# Patient Record
Sex: Female | Born: 1990 | Race: White | Hispanic: No | Marital: Married | Smoking: Never smoker
Health system: Southern US, Community
[De-identification: ages and names within clinical notes are randomized; demographics above are authoritative.]

## PROBLEM LIST (undated history)

## (undated) ENCOUNTER — Inpatient Hospital Stay (HOSPITAL_COMMUNITY): Payer: Self-pay

## (undated) DIAGNOSIS — D649 Anemia, unspecified: Secondary | ICD-10-CM

## (undated) DIAGNOSIS — Z8279 Family history of other congenital malformations, deformations and chromosomal abnormalities: Secondary | ICD-10-CM

## (undated) DIAGNOSIS — T8189XA Other complications of procedures, not elsewhere classified, initial encounter: Secondary | ICD-10-CM

## (undated) DIAGNOSIS — I82409 Acute embolism and thrombosis of unspecified deep veins of unspecified lower extremity: Secondary | ICD-10-CM

## (undated) DIAGNOSIS — J45909 Unspecified asthma, uncomplicated: Secondary | ICD-10-CM

## (undated) HISTORY — PX: BACK SURGERY: SHX140

## (undated) HISTORY — DX: Acute embolism and thrombosis of unspecified deep veins of unspecified lower extremity: I82.409

## (undated) HISTORY — DX: Family history of other congenital malformations, deformations and chromosomal abnormalities: Z82.79

## (undated) HISTORY — DX: Other complications of procedures, not elsewhere classified, initial encounter: T81.89XA

## (undated) HISTORY — PX: EYE SURGERY: SHX253

## (undated) HISTORY — DX: Unspecified asthma, uncomplicated: J45.909

---
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
## 2009-01-28 ENCOUNTER — Encounter: Admission: RE | Admit: 2009-01-28 | Discharge: 2009-01-28 | Payer: Self-pay | Admitting: Orthopedic Surgery

## 2009-02-12 ENCOUNTER — Encounter: Admission: RE | Admit: 2009-02-12 | Discharge: 2009-02-12 | Payer: Self-pay | Admitting: Orthopedic Surgery

## 2010-07-06 ENCOUNTER — Emergency Department (HOSPITAL_BASED_OUTPATIENT_CLINIC_OR_DEPARTMENT_OTHER)
Admission: EM | Admit: 2010-07-06 | Discharge: 2010-07-06 | Disposition: A | Discharge status: Home / Self Care | Payer: 59 | Attending: Emergency Medicine | Admitting: Emergency Medicine

## 2010-07-06 DIAGNOSIS — L272 Dermatitis due to ingested food: Secondary | ICD-10-CM | POA: Insufficient documentation

## 2011-08-24 ENCOUNTER — Encounter: Payer: Self-pay | Admitting: Internal Medicine

## 2011-08-28 ENCOUNTER — Encounter: Payer: Self-pay | Admitting: Internal Medicine

## 2011-08-28 ENCOUNTER — Ambulatory Visit (INDEPENDENT_AMBULATORY_CARE_PROVIDER_SITE_OTHER): Payer: 59

## 2011-08-28 ENCOUNTER — Ambulatory Visit (INDEPENDENT_AMBULATORY_CARE_PROVIDER_SITE_OTHER): Payer: 59 | Admitting: Internal Medicine

## 2011-08-28 VITALS — BP 96/60 | HR 80 | Ht 60.25 in | Wt 127.0 lb

## 2011-08-28 DIAGNOSIS — R220 Localized swelling, mass and lump, head: Secondary | ICD-10-CM

## 2011-08-28 DIAGNOSIS — R221 Localized swelling, mass and lump, neck: Secondary | ICD-10-CM

## 2011-08-28 DIAGNOSIS — Z91018 Allergy to other foods: Secondary | ICD-10-CM

## 2011-08-28 DIAGNOSIS — T781XXA Other adverse food reactions, not elsewhere classified, initial encounter: Secondary | ICD-10-CM

## 2011-08-28 LAB — IGA: IgA: 140 mg/dL (ref 68–378)

## 2011-08-28 NOTE — Progress Notes (Signed)
  Subjective:    Patient ID: Melinda Higgins, female    DOB: 1990-07-08, 21 y.o.   MRN: [id number]  HPI Is a very pleasant 21 year old white woman who is here with her 10-week-old infant. She has had intermittent throat swelling problems. She has peanut and treatment allergies as well as a milk intolerance or allergy. She's had persistent issues and wonders if she might not have gluten intolerance or celiac disease. She has had numerous ER visits with her first and second pregnancy is. She also has a 64-month-old child. She will develop throat swelling, and required Benadryl or even other treatments for anaphylactic-like type reactions or anaphylaxis. I don't believe she's never had respiratory difficulty. I do not have access to those records.  She does have a history of seasonal allergies, some asthma as well as ease food allergies. Recent testing and stone she has a pork allergy as well. She is puzzled by her problems and had wondered if celiac disease might be causing his as she stopped gluten 5 days ago and since she is already somewhat better. Her father has celiac disease.  Allergies  Allergen Reactions  . Benadryl (Diphenhydramine Hcl)    No outpatient prescriptions prior to visit.   Past Medical History  Diagnosis Date  . Asthma    Past Surgical History  Procedure Date  . Eye surgery   . Cesarean section     2 times  . Back surgery     rods put in back   History   Social History  . Marital Status: Single                 Occupational History  . stay at home    Social History Main Topics  . Smoking status: Never Smoker   . Smokeless tobacco: None  . Alcohol Use: No  . Drug Use: No  .       Family History  Problem Relation Age of Onset  . Diabetes    . Heart disease     celiac disease in her for     Review of Systems  currently breast-feeding,    Objective:   Physical Exam General:  Well-developed, well-nourished and in no acute  distress Eyes:  anicteric. ENT:   Mouth and posterior pharynx free of lesions.  Neck:   supple w/o thyromegaly or mass.  Lungs: Clear to auscultation bilaterally. Heart:  S1S2, no rubs, murmurs, gallops. Abdomen:  soft, non-tender, no hepatosplenomegaly, hernia, or mass and BS+.  Lymph:  no cervical or supraclavicular adenopathy. Extremities:   no edema Skin   no rash. Neuro:  A&O x 3.  Psych:  appropriate mood and  Affect.        Assessment & Plan:   1. Throat swelling   2. Food allergy     I explained to the patient in the her symptoms are unusual manifestations of celiac disease. However not unreasonable test are considering her father's history as well. We'll start with IgA level in the tissue transglutaminase antibody IgA. If those are negative, I think it would be reasonable to pursue H. LAD Q. testing. If that were negative then she would not be someone with celiac disease. She has only been gluten-free for 5 days so I think is reasonable to do antibody testing and trust to negative result.    copy to Micah Noel, MD

## 2011-08-28 NOTE — Patient Instructions (Signed)
Please go to the basement to have your celiac (gluten) tests drawn. We will call you with the results and plans.  Copy will be sent to Dr. Barnetta Chapel.

## 2011-08-31 NOTE — Progress Notes (Signed)
Pt contacted and informed that Celiac test negative and that we want to do more testing, test orders put in computer and pt will come by and get these drawn.

## 2011-08-31 NOTE — Progress Notes (Signed)
Quick Note:  Antibody test is negative for celiac disease  She needs to be tested for presence of HLA DQ2 and DQ 8  Let me know if ?'s about what to order ______

## 2011-09-07 ENCOUNTER — Other Ambulatory Visit: Payer: 59

## 2011-09-07 DIAGNOSIS — Z91018 Allergy to other foods: Secondary | ICD-10-CM

## 2011-09-16 ENCOUNTER — Telehealth: Payer: Self-pay | Admitting: Internal Medicine

## 2011-09-16 NOTE — Telephone Encounter (Signed)
Patient advised we will call her when the results are available

## 2013-01-26 ENCOUNTER — Inpatient Hospital Stay (HOSPITAL_COMMUNITY)
Admission: AD | Admit: 2013-01-26 | Discharge: 2013-01-26 | Disposition: A | Discharge status: Home / Self Care | Payer: Medicaid Other | Source: Ambulatory Visit | Attending: Obstetrics and Gynecology | Admitting: Obstetrics and Gynecology

## 2013-01-26 ENCOUNTER — Encounter (HOSPITAL_COMMUNITY): Payer: Self-pay | Admitting: *Deleted

## 2013-01-26 ENCOUNTER — Inpatient Hospital Stay (HOSPITAL_COMMUNITY): Payer: Medicaid Other

## 2013-01-26 DIAGNOSIS — Z3201 Encounter for pregnancy test, result positive: Secondary | ICD-10-CM

## 2013-01-26 DIAGNOSIS — O99891 Other specified diseases and conditions complicating pregnancy: Secondary | ICD-10-CM | POA: Insufficient documentation

## 2013-01-26 DIAGNOSIS — Z3490 Encounter for supervision of normal pregnancy, unspecified, unspecified trimester: Secondary | ICD-10-CM

## 2013-01-26 LAB — HCG, QUANTITATIVE, PREGNANCY: hCG, Beta Chain, Quant, S: 72301 m[IU]/mL — ABNORMAL HIGH (ref <5)

## 2013-01-26 LAB — POCT PREGNANCY, URINE: Preg Test, Ur: POSITIVE — AB

## 2013-01-26 NOTE — MAU Note (Signed)
Pt was at pregnancy care center today. She said she should be 9 wek. Abd U/S at pregnancy care center unable to visualize a fetus. Sent her for U/S. Denies any pain or bleeding.

## 2013-01-26 NOTE — MAU Provider Note (Signed)
History     CSN: [id number]  Arrival date and time: 01/26/13 1526   First Provider Initiated Contact with Patient 01/26/13 1731      Chief Complaint  Patient presents with  . Possible Pregnancy   HPI Ms. Melinda Higgins is a 22 y.o. G3P2002 at [redacted]w[redacted]d who presents to MAU today after going to the pregnancy care center for early [place]. The patient states that she should be almost [redacted] weeks GA and they were unable to see anything in the uterus today. She denies abnormal discharge, vaginal bleeding or abdominal pain. The patient has had N/V and heartburn throughout the pregnancy, but would prefer to use natural remedies. She plans to go to [facility] in [place] for prenatal care.   OB History   Grav Para Term Preterm Abortions TAB SAB Ect Mult Living   3 2 2       2       Past Medical History  Diagnosis Date  . Asthma     Past Surgical History  Procedure Laterality Date  . Eye surgery    . Cesarean section      2 times  . Back surgery      rods put in back    Family History  Problem Relation Age of Onset  . Diabetes    . Heart disease      History  Substance Use Topics  . Smoking status: Never Smoker   . Smokeless tobacco: Not on file  . Alcohol Use: No    Allergies:  Allergies  Allergen Reactions  . Benadryl [Diphenhydramine Hcl]     No prescriptions prior to admission    Review of Systems  Constitutional: Positive for malaise/fatigue. Negative for fever.  Gastrointestinal: Positive for nausea and vomiting. Negative for abdominal pain, diarrhea and constipation.  Genitourinary: Negative for dysuria, urgency and frequency.       Neg - vaginal bleeding, discharge  Neurological: Positive for dizziness. Negative for loss of consciousness and weakness.   Physical Exam   Blood pressure 132/94, pulse 91, temperature 98.3 F (36.8 C), resp. rate 18, height 5\' 5"  (1.651 m), weight 119 lb 6.4 oz (54.159 kg), last menstrual period 11/25/2012.  Physical Exam   Constitutional: She is oriented to person, place, and time. She appears well-developed and well-nourished. No distress.  HENT:  Head: Normocephalic and atraumatic.  Cardiovascular: Normal rate.   Respiratory: Effort normal.  GI: Soft. She exhibits no distension and no mass. There is no tenderness. There is no rebound and no guarding.  Neurological: She is alert and oriented to person, place, and time.  Skin: Skin is warm and dry. No erythema.  Psychiatric: She has a normal mood and affect.   Results for orders placed during the hospital encounter of 01/26/13 (from the past 24 hour(s))  POCT PREGNANCY, URINE     Status: Abnormal   Collection Time    01/26/13  3:48 PM      Result Value Range   Preg Test, Ur POSITIVE (*) NEGATIVE  HCG, QUANTITATIVE, PREGNANCY     Status: Abnormal   Collection Time    01/26/13  4:45 PM      Result Value Range   hCG, Beta Chain, Quant, S 72301 (*) <5 mIU/mL   US Ob Comp Less 14 Wks  01/26/2013   CLINICAL DATA:  Inconclusive fetal viability, positive pregnancy test  EXAM: OBSTETRIC <14 WK [place] AND TRANSVAGINAL OB US  TECHNIQUE: Both transabdominal and transvaginal ultrasound examinations were performed for  complete evaluation of the gestation as well as the maternal uterus, adnexal regions, and pelvic cul-de-sac. Transvaginal technique was performed to assess early pregnancy.  COMPARISON:  None.  FINDINGS: Intrauterine gestational sac: Visualized/normal in shape.  Yolk sac:  Visualized  Embryo:  Visualized  Cardiac Activity: Visualized  Heart Rate:  175 bpm  CRL:   21  mm   8 w 5d                  [place] EDC: 09/02/13  Maternal uterus/adnexae: The ovaries are normal.  IMPRESSION: Single live intrauterine gestation with concordant MEASUREMENTS by today's crown-rump length compared to assigned gestational age of [redacted] weeks 6 days by LMP. No acute abnormality.   Electronically Signed   By: Christiana Pellant M.D.   On: 01/26/2013 18:14   US Ob Transvaginal  01/26/2013    CLINICAL DATA:  Inconclusive fetal viability, positive pregnancy test  EXAM: OBSTETRIC <14 WK [place] AND TRANSVAGINAL OB US  TECHNIQUE: Both transabdominal and transvaginal ultrasound examinations were performed for complete evaluation of the gestation as well as the maternal uterus, adnexal regions, and pelvic cul-de-sac. Transvaginal technique was performed to assess early pregnancy.  COMPARISON:  None.  FINDINGS: Intrauterine gestational sac: Visualized/normal in shape.  Yolk sac:  Visualized  Embryo:  Visualized  Cardiac Activity: Visualized  Heart Rate:  175 bpm  CRL:   21  mm   8 w 5d                  [place] EDC: 09/02/13  Maternal uterus/adnexae: The ovaries are normal.  IMPRESSION: Single live intrauterine gestation with concordant MEASUREMENTS by today's crown-rump length compared to assigned gestational age of [redacted] weeks 6 days by LMP. No acute abnormality.   Electronically Signed   By: Christiana Pellant M.D.   On: 01/26/2013 18:14    MAU Course  Procedures None  MDM +UPT Quant hCG and [place] today  Assessment and Plan  A: SIUP at [redacted]w[redacted]d with normal cardiac activity  P: Discharge home First trimester warning signs reviewed Patient declined Rx for anti-emetics or antacids Pregnancy confirmation letter given Patient advised to make an appointment to start care with [facility] in [place] Patient may return to MAU as needed or if her condition were to change or worsen  Freddi Starr, PA-C  01/26/2013, 5:31 PM

## 2013-01-31 NOTE — MAU Provider Note (Signed)
Attestation of Attending Supervision of Advanced Practitioner (CNM/NP): Evaluation and management procedures were performed by the Advanced Practitioner under my supervision and collaboration.  I have reviewed the Advanced Practitioner's note and chart, and I agree with the management and plan.  Parris Signer 01/31/2013 4:23 PM   

## 2013-03-09 ENCOUNTER — Encounter (HOSPITAL_COMMUNITY): Payer: Self-pay | Admitting: *Deleted

## 2013-03-09 ENCOUNTER — Inpatient Hospital Stay (HOSPITAL_COMMUNITY)
Admission: AD | Admit: 2013-03-09 | Discharge: 2013-03-09 | Disposition: A | Discharge status: Home / Self Care | Payer: Medicaid Other | Source: Ambulatory Visit | Attending: Obstetrics & Gynecology | Admitting: Obstetrics & Gynecology

## 2013-03-09 DIAGNOSIS — O210 Mild hyperemesis gravidarum: Secondary | ICD-10-CM

## 2013-03-09 DIAGNOSIS — O219 Vomiting of pregnancy, unspecified: Secondary | ICD-10-CM

## 2013-03-09 LAB — URINALYSIS, ROUTINE W REFLEX MICROSCOPIC
Bilirubin Urine: NEGATIVE
Hgb urine dipstick: NEGATIVE
Ketones, ur: NEGATIVE mg/dL
Nitrite: NEGATIVE
Specific Gravity, Urine: 1.02 (ref 1.005–1.030)
Urobilinogen, UA: 0.2 mg/dL (ref 0.0–1.0)

## 2013-03-09 MED ORDER — PROMETHAZINE HCL 25 MG/ML IJ SOLN
25.0000 mg | Freq: Once | INTRAVENOUS | Status: AC
  Administered 2013-03-09: 25 mg via INTRAVENOUS
  Filled 2013-03-09: qty 1

## 2013-03-09 NOTE — MAU Note (Signed)
Pt states she took medication for vomiting this morning but has not eaten anything

## 2013-03-09 NOTE — MAU Note (Addendum)
Ongoing problem with vomiting. Unable to void. Feeling weak

## 2013-03-09 NOTE — MAU Provider Note (Signed)
History     CSN: [id number]  Arrival date and time: 03/09/13 1637   None     Chief Complaint  Patient presents with  . Hyperemesis Gravidarum   HPI Melinda Higgins is 22 y.o. G3P2002 [redacted]w[redacted]d weeks presenting with persistent nausea and vomiting.  She is a patient at [facility] but is unhappy because she doesn't think she is being heard.  She has been seen once here and 5 times in [place] in the ED for Iv hydration. She plans to continue prenatal care in [place].  Has not been able to drink or eat in 24 hrs, lots of dry heaving and vomited X 2 today.   Feels week.   Is taking daily Diclegis, Phenergan suppositories that have been helpful.  Zofran did not help at all.  Denies vaginal bleeding.  Weight on 11/6 was 119 and today she weighs 124.   She is also concerned because she said they didn't listen for a heartbeat when she was in the ED earlier this week.  Past Medical History  Diagnosis Date  . Asthma     Past Surgical History  Procedure Laterality Date  . Eye surgery    . Cesarean section      2 times  . Back surgery      rods put in back    Family History  Problem Relation Age of Onset  . Diabetes    . Heart disease      History  Substance Use Topics  . Smoking status: Never Smoker   . Smokeless tobacco: Not on file  . Alcohol Use: No    Allergies:  Allergies  Allergen Reactions  . Benadryl [Diphenhydramine Hcl] Other (See Comments)    Makes pt numb all over    Prescriptions prior to admission  Medication Sig Dispense Refill  . cyclobenzaprine (FLEXERIL) 5 MG tablet Take 5-10 mg by mouth daily as needed for muscle spasms.      . Doxylamine-Pyridoxine (DICLEGIS) 10-10 MG TBEC Take 1 tablet by mouth 3 (three) times daily. Pt takes one tablet in AM, one in the afternoon , and 2 tablets a bedtime.      . flintstones complete (FLINTSTONES) 60 MG chewable tablet Chew 1 tablet by mouth daily. Pt takes if she can keep it down.      . Prenatal Vit-Fe Fumarate-FA  (PRENATAL MULTIVITAMIN) TABS tablet Take 1 tablet by mouth daily at 12 noon.      . promethazine (PHENERGAN) 25 MG suppository Place 25 mg rectally every 6 (six) hours as needed for nausea or vomiting.        Review of Systems  Gastrointestinal: Positive for nausea and vomiting. Negative for abdominal pain.  Genitourinary:       Neg for vaginal bleeding or discharge  Neurological: Positive for weakness.   Physical Exam   Blood pressure 111/64, temperature 98.4 F (36.9 C), temperature source Oral, resp. rate 16, weight 123 lb (55.792 kg), last menstrual period 11/25/2012.  Physical Exam  Constitutional: She is oriented to person, place, and time. She appears well-developed and well-nourished. No distress.  Neurological: She is alert and oriented to person, place, and time.  Skin: Skin is warm and dry.  Psychiatric: She has a normal mood and affect. Her behavior is normal.   FETAL HEART RATE BY DOPPLER 145  Results for orders placed during the hospital encounter of 03/09/13 (from the past 24 hour(s))  URINALYSIS, ROUTINE W REFLEX MICROSCOPIC     Status: Abnormal  Collection Time    03/09/13  5:17 PM      Result Value Range   Color, Urine YELLOW  YELLOW   APPearance HAZY (*) CLEAR   Specific Gravity, Urine 1.020  1.005 - 1.030   pH 5.5  5.0 - 8.0   Glucose, UA NEGATIVE  NEGATIVE mg/dL   Hgb urine dipstick NEGATIVE  NEGATIVE   Bilirubin Urine NEGATIVE  NEGATIVE   Ketones, ur NEGATIVE  NEGATIVE mg/dL   Protein, ur NEGATIVE  NEGATIVE mg/dL   Urobilinogen, UA 0.2  0.0 - 1.0 mg/dL   Nitrite NEGATIVE  NEGATIVE   Leukocytes, UA NEGATIVE  NEGATIVE   MAU Course  Procedures   IV hydration with 1 liter of LR  MDM Reviewed the UA with the patient and explained nausea/vomiting cycle.  Will treat with 1 liter of fluid/phenergan.  She has antiemetics at home   Patient is feeling much better after IV hydration, ready for discharge.    Assessment and Plan  A:  Nausea and vomiting in  second trimester pregnancy      Second trimester  Viable pregnancy at [redacted]w[redacted]d gestation  P: Continue antiemetics she has at home      Patient plans to transfer OB care to [facility].    Sasan Wilkie,EVE M 03/09/2013, 7:55 PM

## 2013-03-10 NOTE — MAU Provider Note (Signed)
Attestation of Attending Supervision of Advanced Practitioner (PA/CNM/NP): Evaluation and management procedures were performed by the Advanced Practitioner under my supervision and collaboration.  I have reviewed the Advanced Practitioner's note and chart, and I agree with the management and plan.  Daneisha Surges, MD, FACOG Attending Obstetrician & Gynecologist Faculty Practice, [facility]  

## 2013-04-19 DIAGNOSIS — J45909 Unspecified asthma, uncomplicated: Secondary | ICD-10-CM | POA: Insufficient documentation

## 2013-04-19 DIAGNOSIS — M4310 Spondylolisthesis, site unspecified: Secondary | ICD-10-CM | POA: Insufficient documentation

## 2013-04-19 DIAGNOSIS — Z981 Arthrodesis status: Secondary | ICD-10-CM | POA: Insufficient documentation

## 2013-04-19 DIAGNOSIS — M419 Scoliosis, unspecified: Secondary | ICD-10-CM | POA: Insufficient documentation

## 2013-12-01 ENCOUNTER — Encounter (HOSPITAL_COMMUNITY): Payer: Self-pay | Admitting: *Deleted

## 2014-01-22 ENCOUNTER — Encounter (HOSPITAL_COMMUNITY): Payer: Self-pay | Admitting: *Deleted

## 2015-12-19 ENCOUNTER — Encounter (HOSPITAL_COMMUNITY): Payer: Self-pay

## 2015-12-19 ENCOUNTER — Inpatient Hospital Stay (HOSPITAL_COMMUNITY)
Admission: AD | Admit: 2015-12-19 | Discharge: 2015-12-19 | Disposition: A | Discharge status: Home / Self Care | Payer: Medicaid Other | Source: Ambulatory Visit | Attending: Obstetrics & Gynecology | Admitting: Obstetrics & Gynecology

## 2015-12-19 ENCOUNTER — Inpatient Hospital Stay (HOSPITAL_COMMUNITY): Payer: Medicaid Other

## 2015-12-19 DIAGNOSIS — R102 Pelvic and perineal pain: Secondary | ICD-10-CM | POA: Diagnosis present

## 2015-12-19 DIAGNOSIS — O26891 Other specified pregnancy related conditions, first trimester: Secondary | ICD-10-CM | POA: Insufficient documentation

## 2015-12-19 DIAGNOSIS — Z3A01 Less than 8 weeks gestation of pregnancy: Secondary | ICD-10-CM | POA: Diagnosis not present

## 2015-12-19 DIAGNOSIS — O3680X0 Pregnancy with inconclusive fetal viability, not applicable or unspecified: Secondary | ICD-10-CM

## 2015-12-19 HISTORY — DX: Anemia, unspecified: D64.9

## 2015-12-19 LAB — CBC
HEMATOCRIT: 34.1 % — AB (ref 36.0–46.0)
HEMOGLOBIN: 12 g/dL (ref 12.0–15.0)
MCH: 27.8 pg (ref 26.0–34.0)
MCHC: 35.2 g/dL (ref 30.0–36.0)
MCV: 79.1 fL (ref 78.0–100.0)
Platelets: 243 10*3/uL (ref 150–400)
RBC: 4.31 MIL/uL (ref 3.87–5.11)
RDW: 13.1 % (ref 11.5–15.5)
WBC: 8.3 10*3/uL (ref 4.0–10.5)

## 2015-12-19 LAB — URINALYSIS, ROUTINE W REFLEX MICROSCOPIC
Bilirubin Urine: NEGATIVE
GLUCOSE, UA: NEGATIVE mg/dL
Hgb urine dipstick: NEGATIVE
Ketones, ur: NEGATIVE mg/dL
LEUKOCYTES UA: NEGATIVE
NITRITE: NEGATIVE
PH: 5.5 (ref 5.0–8.0)
Protein, ur: NEGATIVE mg/dL

## 2015-12-19 LAB — WET PREP, GENITAL
Clue Cells Wet Prep HPF POC: NONE SEEN
Sperm: NONE SEEN
Trich, Wet Prep: NONE SEEN
WBC, Wet Prep HPF POC: NONE SEEN
Yeast Wet Prep HPF POC: NONE SEEN

## 2015-12-19 LAB — POCT PREGNANCY, URINE: Preg Test, Ur: POSITIVE — AB

## 2015-12-19 LAB — HCG, QUANTITATIVE, PREGNANCY: HCG, BETA CHAIN, QUANT, S: 5061 m[IU]/mL — AB (ref <5)

## 2015-12-19 NOTE — MAU Provider Note (Signed)
History     CSN: [id number]  Arrival date and time: 12/19/15 2106   First Provider Initiated Contact with Patient 12/19/15 2206      Chief Complaint  Patient presents with  . Pelvic Pain   Pelvic Pain  The patient's primary symptoms include pelvic pain. This is a new problem. The current episode started today. The problem occurs constantly. The problem has been unchanged. Pain severity now: 7/10  The problem affects the left (worse with standing) side. Associated symptoms include abdominal pain, chills and nausea. Pertinent negatives include no constipation, diarrhea, dysuria, fever (99.8 at home today ), frequency, urgency or vomiting. The vaginal discharge was normal. There has been no bleeding. The symptoms are aggravated by activity. She has tried nothing for the symptoms. Menstrual history: LMP 11/12/15    Past Medical History:  Diagnosis Date  . Anemia   . Asthma     Past Surgical History:  Procedure Laterality Date  . BACK SURGERY     rods put in back  . CESAREAN SECTION     3 times  . EYE SURGERY      Family History  Problem Relation Age of Onset  . Diabetes    . Heart disease      Social History  Substance Use Topics  . Smoking status: Never Smoker  . Smokeless tobacco: Never Used  . Alcohol use No    Allergies:  Allergies  Allergen Reactions  . Benadryl [Diphenhydramine Hcl] Other (See Comments)    Makes pt numb all over    Prescriptions Prior to Admission  Medication Sig Dispense Refill Last Dose  . cyclobenzaprine (FLEXERIL) 5 MG tablet Take 5-10 mg by mouth daily as needed for muscle spasms.   Past Month at Unknown time  . Doxylamine-Pyridoxine (DICLEGIS) 10-10 MG TBEC Take 1 tablet by mouth 3 (three) times daily. Pt takes one tablet in AM, one in the afternoon , and 2 tablets a bedtime.   03/09/2013 at Unknown time  . flintstones complete (FLINTSTONES) 60 MG chewable tablet Chew 1 tablet by mouth daily. Pt takes if she can keep it down.   Past Week  at Unknown time  . Prenatal Vit-Fe Fumarate-FA (PRENATAL MULTIVITAMIN) TABS tablet Take 1 tablet by mouth daily at 12 noon.   Past Month at Unknown time  . promethazine (PHENERGAN) 25 MG suppository Place 25 mg rectally every 6 (six) hours as needed for nausea or vomiting.   03/08/2013 at Unknown time    Review of Systems  Constitutional: Positive for chills. Negative for fever (99.8 at home today ).  Gastrointestinal: Positive for abdominal pain and nausea. Negative for constipation, diarrhea and vomiting.  Genitourinary: Positive for pelvic pain. Negative for dysuria, frequency and urgency.   Physical Exam   Blood pressure 133/78, pulse 83, temperature 98.2 F (36.8 C), temperature source Oral, resp. rate 20, height 5\' 5"  (1.651 m), weight 125 lb (56.7 kg), last menstrual period 11/12/2015, SpO2 100 %, unknown if currently breastfeeding.  Physical Exam  Nursing note and vitals reviewed. Constitutional: She is oriented to person, place, and time. She appears well-developed and well-nourished. No distress.  HENT:  Head: Normocephalic.  Cardiovascular: Normal rate.   Respiratory: Effort normal.  GI: Soft. There is no tenderness. There is no rebound.  Genitourinary:  Genitourinary Comments:  External: no lesion Vagina: small amount of white discharge Cervix: pink, smooth, no CMT Uterus: NSSC Adnexa: NT   Neurological: She is alert and oriented to person, place, and time.  Skin: Skin is warm and dry.  Psychiatric: She has a normal mood and affect.     Results for orders placed or performed during the hospital encounter of 12/19/15 (from the past 24 hour(s))  Urinalysis, Routine w reflex microscopic ([facility])     Status: Abnormal   Collection Time: 12/19/15  9:28 PM  Result Value Ref Range   Color, Urine YELLOW YELLOW   APPearance CLEAR CLEAR   Specific Gravity, Urine >1.030 (H) 1.005 - 1.030   pH 5.5 5.0 - 8.0   Glucose, UA NEGATIVE NEGATIVE mg/dL   Hgb urine dipstick  NEGATIVE NEGATIVE   Bilirubin Urine NEGATIVE NEGATIVE   Ketones, ur NEGATIVE NEGATIVE mg/dL   Protein, ur NEGATIVE NEGATIVE mg/dL   Nitrite NEGATIVE NEGATIVE   Leukocytes, UA NEGATIVE NEGATIVE  Pregnancy, urine POC     Status: Abnormal   Collection Time: 12/19/15  9:53 PM  Result Value Ref Range   Preg Test, Ur POSITIVE (A) NEGATIVE  Wet prep, genital     Status: None   Collection Time: 12/19/15 10:10 PM  Result Value Ref Range   Yeast Wet Prep HPF POC NONE SEEN NONE SEEN   Trich, Wet Prep NONE SEEN NONE SEEN   Clue Cells Wet Prep HPF POC NONE SEEN NONE SEEN   WBC, Wet Prep HPF POC NONE SEEN NONE SEEN   Sperm NONE SEEN   CBC     Status: Abnormal   Collection Time: 12/19/15 10:23 PM  Result Value Ref Range   WBC 8.3 4.0 - 10.5 K/uL   RBC 4.31 3.87 - 5.11 MIL/uL   Hemoglobin 12.0 12.0 - 15.0 g/dL   HCT 16.134.1 (L) 09.636.0 - 04.546.0 %   MCV 79.1 78.0 - 100.0 fL   MCH 27.8 26.0 - 34.0 pg   MCHC 35.2 30.0 - 36.0 g/dL   RDW 40.913.1 81.111.5 - 91.415.5 %   Platelets 243 150 - 400 K/uL  hCG, quantitative, pregnancy     Status: Abnormal   Collection Time: 12/19/15 10:23 PM  Result Value Ref Range   hCG, Beta Chain, Quant, S 5,061 (H) <5 mIU/mL  ABO/Rh     Status: None (Preliminary result)   Collection Time: 12/19/15 10:23 PM  Result Value Ref Range   ABO/RH(D) AB POS    [place] Ob Comp Less 14 Wks  Result Date: 12/19/2015 CLINICAL DATA:  25 year old female with left lower quadrant abdominal pain and cramping. EXAM: OBSTETRIC <14 WK US AND TRANSVAGINAL OB US TECHNIQUE: Both transabdominal and transvaginal ultrasound examinations were performed for complete evaluation of the gestation as well as the maternal uterus, adnexal regions, and pelvic cul-de-sac. Transvaginal technique was performed to assess early pregnancy. COMPARISON:  None for this pregnancy FINDINGS: Intrauterine gestational sac: Single intrauterine gestational sac. Yolk sac: A faint curvilinear structure within the gestational sac most likely  represents a developing yolk sac. Embryo:  Not seen Cardiac Activity: NA Heart Rate: NA  bpm MSD: 6  mm   5 w   2  d Subchorionic hemorrhage:  None visualized. Maternal uterus/adnexae: The left ovary appears unremarkable. The corpus luteum is noted in the right ovary. No significant free fluid within the pelvis. IMPRESSION: Single intrauterine early gestational sac with a probable yolk sac. No fetal pole identified at this time. Follow-up with ultrasound recommended. Electronically Signed   By: Elgie CollardArash  Radparvar M.D.   On: 12/19/2015 23:24   [place] Ob Transvaginal  Result Date: 12/19/2015 CLINICAL DATA:  25 year old female with left lower  quadrant abdominal pain and cramping. EXAM: OBSTETRIC <14 WK [place] AND TRANSVAGINAL OB US TECHNIQUE: Both transabdominal and transvaginal ultrasound examinations were performed for complete evaluation of the gestation as well as the maternal uterus, adnexal regions, and pelvic cul-de-sac. Transvaginal technique was performed to assess early pregnancy. COMPARISON:  None for this pregnancy FINDINGS: Intrauterine gestational sac: Single intrauterine gestational sac. Yolk sac: A faint curvilinear structure within the gestational sac most likely represents a developing yolk sac. Embryo:  Not seen Cardiac Activity: NA Heart Rate: NA  bpm MSD: 6  mm   5 w   2  d Subchorionic hemorrhage:  None visualized. Maternal uterus/adnexae: The left ovary appears unremarkable. The corpus luteum is noted in the right ovary. No significant free fluid within the pelvis. IMPRESSION: Single intrauterine early gestational sac with a probable yolk sac. No fetal pole identified at this time. Follow-up with ultrasound recommended. Electronically Signed   By: Elgie Collard M.D.   On: 12/19/2015 23:24     MAU Course  Procedures  MDM   Assessment and Plan   1. Pregnancy, location unknown   2. Pelvic pain in pregnancy, antepartum, first trimester    DC home Comfort measures reviewed  1st Trimester  precautions  Bleeding precautions Ectopic precautions RX: none  Return to MAU as needed FU with OB as planned  Follow-up Information    [facility]  MATERNITY ADMISSIONS .   Why:  12/21/15 in the evening for repeat blood work  Contact information: [address] [id number] mc [place] [address] [phone]           Tawnya Crook 12/19/2015, 10:07 PM

## 2015-12-19 NOTE — MAU Note (Signed)
Pt states that she started having severe abdominal cramping that started after she had dinner. Felt like insides were being twisted. Rates 7/10. Denies vag bleeding. LMP: 11/12/2015.

## 2015-12-19 NOTE — Discharge Instructions (Signed)
First Trimester of Pregnancy The first trimester of pregnancy is from week 1 until the end of week 12 (months 1 through 3). A week after a sperm fertilizes an egg, the egg will implant on the wall of the uterus. This embryo will begin to develop into a baby. Genes from you and your partner are forming the baby. The female genes determine whether the baby is a boy or a girl. At 6-8 weeks, the eyes and face are formed, and the heartbeat can be seen on ultrasound. At the end of 12 weeks, all the baby's organs are formed.  Now that you are pregnant, you will want to do everything you can to have a healthy baby. Two of the most important things are to get good prenatal care and to follow your health care provider's instructions. Prenatal care is all the medical care you receive before the baby's birth. This care will help prevent, find, and treat any problems during the pregnancy and childbirth. BODY CHANGES Your body goes through many changes during pregnancy. The changes vary from woman to woman.   You may gain or lose a couple of pounds at first.  You may feel sick to your stomach (nauseous) and throw up (vomit). If the vomiting is uncontrollable, call your health care provider.  You may tire easily.  You may develop headaches that can be relieved by medicines approved by your health care provider.  You may urinate more often. Painful urination may mean you have a bladder infection.  You may develop heartburn as a result of your pregnancy.  You may develop constipation because certain hormones are causing the muscles that push waste through your intestines to slow down.  You may develop hemorrhoids or swollen, bulging veins (varicose veins).  Your breasts may begin to grow larger and become tender. Your nipples may stick out more, and the tissue that surrounds them (areola) may become darker.  Your gums may bleed and may be sensitive to brushing and flossing.  Dark spots or blotches (chloasma,  mask of pregnancy) may develop on your face. This will likely fade after the baby is born.  Your menstrual periods will stop.  You may have a loss of appetite.  You may develop cravings for certain kinds of food.  You may have changes in your emotions from day to day, such as being excited to be pregnant or being concerned that something may go wrong with the pregnancy and baby.  You may have more vivid and strange dreams.  You may have changes in your hair. These can include thickening of your hair, rapid growth, and changes in texture. Some women also have hair loss during or after pregnancy, or hair that feels dry or thin. Your hair will most likely return to normal after your baby is born. WHAT TO EXPECT AT YOUR PRENATAL VISITS During a routine prenatal visit:  You will be weighed to make sure you and the baby are growing normally.  Your blood pressure will be taken.  Your abdomen will be measured to track your baby's growth.  The fetal heartbeat will be listened to starting around week 10 or 12 of your pregnancy.  Test results from any previous visits will be discussed. Your health care provider may ask you:  How you are feeling.  If you are feeling the baby move.  If you have had any abnormal symptoms, such as leaking fluid, bleeding, severe headaches, or abdominal cramping.  If you are using any tobacco products,   including cigarettes, chewing tobacco, and electronic cigarettes.  If you have any questions. Other tests that may be performed during your first trimester include:  Blood tests to find your blood type and to check for the presence of any previous infections. They will also be used to check for low iron levels (anemia) and Rh antibodies. Later in the pregnancy, blood tests for diabetes will be done along with other tests if problems develop.  Urine tests to check for infections, diabetes, or protein in the urine.  An ultrasound to confirm the proper growth  and development of the baby.  An amniocentesis to check for possible genetic problems.  Fetal screens for spina bifida and Down syndrome.  You may need other tests to make sure you and the baby are doing well.  HIV (human immunodeficiency virus) testing. Routine prenatal testing includes screening for HIV, unless you choose not to have this test. HOME CARE INSTRUCTIONS  Medicines  Follow your health care provider's instructions regarding medicine use. Specific medicines may be either safe or unsafe to take during pregnancy.  Take your prenatal vitamins as directed.  If you develop constipation, try taking a stool softener if your health care provider approves. Diet  Eat regular, well-balanced meals. Choose a variety of foods, such as meat or vegetable-based protein, fish, milk and low-fat dairy products, vegetables, fruits, and whole grain breads and cereals. Your health care provider will help you determine the amount of weight gain that is right for you.  Avoid raw meat and uncooked cheese. These carry germs that can cause birth defects in the baby.  Eating four or five small meals rather than three large meals a day may help relieve nausea and vomiting. If you start to feel nauseous, eating a few soda crackers can be helpful. Drinking liquids between meals instead of during meals also seems to help nausea and vomiting.  If you develop constipation, eat more high-fiber foods, such as fresh vegetables or fruit and whole grains. Drink enough fluids to keep your urine clear or pale yellow. Activity and Exercise  Exercise only as directed by your health care provider. Exercising will help you:  Control your weight.  Stay in shape.  Be prepared for labor and delivery.  Experiencing pain or cramping in the lower abdomen or low back is a good sign that you should stop exercising. Check with your health care provider before continuing normal exercises.  Try to avoid standing for long  periods of time. Move your legs often if you must stand in one place for a long time.  Avoid heavy lifting.  Wear low-heeled shoes, and practice good posture.  You may continue to have sex unless your health care provider directs you otherwise. Relief of Pain or Discomfort  Wear a good support bra for breast tenderness.   Take warm sitz baths to soothe any pain or discomfort caused by hemorrhoids. Use hemorrhoid cream if your health care provider approves.   Rest with your legs elevated if you have leg cramps or low back pain.  If you develop varicose veins in your legs, wear support hose. Elevate your feet for 15 minutes, 3-4 times a day. Limit salt in your diet. Prenatal Care  Schedule your prenatal visits by the twelfth week of pregnancy. They are usually scheduled monthly at first, then more often in the last 2 months before delivery.  Write down your questions. Take them to your prenatal visits.  Keep all your prenatal visits as directed by your   health care provider. Safety  Wear your seat belt at all times when driving.  Make a list of emergency phone numbers, including numbers for family, friends, the hospital, and police and fire departments. General Tips  Ask your health care provider for a referral to a local prenatal education class. Begin classes no later than at the beginning of month 6 of your pregnancy.  Ask for help if you have counseling or nutritional needs during pregnancy. Your health care provider can offer advice or refer you to specialists for help with various needs.  Do not use hot tubs, steam rooms, or saunas.  Do not douche or use tampons or scented sanitary pads.  Do not cross your legs for long periods of time.  Avoid cat litter boxes and soil used by cats. These carry germs that can cause birth defects in the baby and possibly loss of the fetus by miscarriage or stillbirth.  Avoid all smoking, herbs, alcohol, and medicines not prescribed by  your health care provider. Chemicals in these affect the formation and growth of the baby.  Do not use any tobacco products, including cigarettes, chewing tobacco, and electronic cigarettes. If you need help quitting, ask your health care provider. You may receive counseling support and other resources to help you quit.  Schedule a dentist appointment. At home, brush your teeth with a soft toothbrush and be gentle when you floss. SEEK MEDICAL CARE IF:   You have dizziness.  You have mild pelvic cramps, pelvic pressure, or nagging pain in the abdominal area.  You have persistent nausea, vomiting, or diarrhea.  You have a bad smelling vaginal discharge.  You have pain with urination.  You notice increased swelling in your face, hands, legs, or ankles. SEEK IMMEDIATE MEDICAL CARE IF:   You have a fever.  You are leaking fluid from your vagina.  You have spotting or bleeding from your vagina.  You have severe abdominal cramping or pain.  You have rapid weight gain or loss.  You vomit blood or material that looks like coffee grounds.  You are exposed to German measles and have never had them.  You are exposed to fifth disease or chickenpox.  You develop a severe headache.  You have shortness of breath.  You have any kind of trauma, such as from a fall or a car accident.   This information is not intended to replace advice given to you by your health care provider. Make sure you discuss any questions you have with your health care provider.   Document Released: 03/03/2001 Document Revised: 03/30/2014 Document Reviewed: 01/17/2013 Elsevier Interactive Patient Education 2016 Elsevier Inc.  

## 2015-12-20 LAB — GC/CHLAMYDIA PROBE AMP (~~LOC~~) NOT AT ARMC
CHLAMYDIA, DNA PROBE: NEGATIVE
Neisseria Gonorrhea: NEGATIVE

## 2015-12-20 LAB — ABO/RH: ABO/RH(D): AB POS

## 2015-12-20 LAB — RPR: RPR: NONREACTIVE

## 2015-12-20 LAB — HIV ANTIBODY (ROUTINE TESTING W REFLEX): HIV SCREEN 4TH GENERATION: NONREACTIVE

## 2015-12-21 ENCOUNTER — Inpatient Hospital Stay (HOSPITAL_COMMUNITY)
Admission: AD | Admit: 2015-12-21 | Discharge: 2015-12-21 | Disposition: A | Discharge status: Home / Self Care | Payer: Medicaid Other | Source: Ambulatory Visit | Attending: Obstetrics & Gynecology | Admitting: Obstetrics & Gynecology

## 2015-12-21 DIAGNOSIS — O26891 Other specified pregnancy related conditions, first trimester: Secondary | ICD-10-CM | POA: Insufficient documentation

## 2015-12-21 DIAGNOSIS — Z3A01 Less than 8 weeks gestation of pregnancy: Secondary | ICD-10-CM | POA: Insufficient documentation

## 2015-12-21 DIAGNOSIS — O3680X0 Pregnancy with inconclusive fetal viability, not applicable or unspecified: Secondary | ICD-10-CM

## 2015-12-21 DIAGNOSIS — R109 Unspecified abdominal pain: Secondary | ICD-10-CM

## 2015-12-21 DIAGNOSIS — O9989 Other specified diseases and conditions complicating pregnancy, childbirth and the puerperium: Secondary | ICD-10-CM

## 2015-12-21 LAB — HCG, QUANTITATIVE, PREGNANCY: HCG, BETA CHAIN, QUANT, S: 8919 m[IU]/mL — AB (ref <5)

## 2015-12-21 NOTE — Discharge Instructions (Signed)
First Trimester of Pregnancy The first trimester of pregnancy is from week 1 until the end of week 12 (months 1 through 3). A week after a sperm fertilizes an egg, the egg will implant on the wall of the uterus. This embryo will begin to develop into a baby. Genes from you and your partner are forming the baby. The female genes determine whether the baby is a boy or a girl. At 6-8 weeks, the eyes and face are formed, and the heartbeat can be seen on ultrasound. At the end of 12 weeks, all the baby's organs are formed.  Now that you are pregnant, you will want to do everything you can to have a healthy baby. Two of the most important things are to get good prenatal care and to follow your health care provider's instructions. Prenatal care is all the medical care you receive before the baby's birth. This care will help prevent, find, and treat any problems during the pregnancy and childbirth. BODY CHANGES Your body goes through many changes during pregnancy. The changes vary from woman to woman.   You may gain or lose a couple of pounds at first.  You may feel sick to your stomach (nauseous) and throw up (vomit). If the vomiting is uncontrollable, call your health care provider.  You may tire easily.  You may develop headaches that can be relieved by medicines approved by your health care provider.  You may urinate more often. Painful urination may mean you have a bladder infection.  You may develop heartburn as a result of your pregnancy.  You may develop constipation because certain hormones are causing the muscles that push waste through your intestines to slow down.  You may develop hemorrhoids or swollen, bulging veins (varicose veins).  Your breasts may begin to grow larger and become tender. Your nipples may stick out more, and the tissue that surrounds them (areola) may become darker.  Your gums may bleed and may be sensitive to brushing and flossing.  Dark spots or blotches (chloasma,  mask of pregnancy) may develop on your face. This will likely fade after the baby is born.  Your menstrual periods will stop.  You may have a loss of appetite.  You may develop cravings for certain kinds of food.  You may have changes in your emotions from day to day, such as being excited to be pregnant or being concerned that something may go wrong with the pregnancy and baby.  You may have more vivid and strange dreams.  You may have changes in your hair. These can include thickening of your hair, rapid growth, and changes in texture. Some women also have hair loss during or after pregnancy, or hair that feels dry or thin. Your hair will most likely return to normal after your baby is born. WHAT TO EXPECT AT YOUR PRENATAL VISITS During a routine prenatal visit:  You will be weighed to make sure you and the baby are growing normally.  Your blood pressure will be taken.  Your abdomen will be measured to track your baby's growth.  The fetal heartbeat will be listened to starting around week 10 or 12 of your pregnancy.  Test results from any previous visits will be discussed. Your health care provider may ask you:  How you are feeling.  If you are feeling the baby move.  If you have had any abnormal symptoms, such as leaking fluid, bleeding, severe headaches, or abdominal cramping.  If you are using any tobacco products,   including cigarettes, chewing tobacco, and electronic cigarettes.  If you have any questions. Other tests that may be performed during your first trimester include:  Blood tests to find your blood type and to check for the presence of any previous infections. They will also be used to check for low iron levels (anemia) and Rh antibodies. Later in the pregnancy, blood tests for diabetes will be done along with other tests if problems develop.  Urine tests to check for infections, diabetes, or protein in the urine.  An ultrasound to confirm the proper growth  and development of the baby.  An amniocentesis to check for possible genetic problems.  Fetal screens for spina bifida and Down syndrome.  You may need other tests to make sure you and the baby are doing well.  HIV (human immunodeficiency virus) testing. Routine prenatal testing includes screening for HIV, unless you choose not to have this test. HOME CARE INSTRUCTIONS  Medicines  Follow your health care provider's instructions regarding medicine use. Specific medicines may be either safe or unsafe to take during pregnancy.  Take your prenatal vitamins as directed.  If you develop constipation, try taking a stool softener if your health care provider approves. Diet  Eat regular, well-balanced meals. Choose a variety of foods, such as meat or vegetable-based protein, fish, milk and low-fat dairy products, vegetables, fruits, and whole grain breads and cereals. Your health care provider will help you determine the amount of weight gain that is right for you.  Avoid raw meat and uncooked cheese. These carry germs that can cause birth defects in the baby.  Eating four or five small meals rather than three large meals a day may help relieve nausea and vomiting. If you start to feel nauseous, eating a few soda crackers can be helpful. Drinking liquids between meals instead of during meals also seems to help nausea and vomiting.  If you develop constipation, eat more high-fiber foods, such as fresh vegetables or fruit and whole grains. Drink enough fluids to keep your urine clear or pale yellow. Activity and Exercise  Exercise only as directed by your health care provider. Exercising will help you:  Control your weight.  Stay in shape.  Be prepared for labor and delivery.  Experiencing pain or cramping in the lower abdomen or low back is a good sign that you should stop exercising. Check with your health care provider before continuing normal exercises.  Try to avoid standing for long  periods of time. Move your legs often if you must stand in one place for a long time.  Avoid heavy lifting.  Wear low-heeled shoes, and practice good posture.  You may continue to have sex unless your health care provider directs you otherwise. Relief of Pain or Discomfort  Wear a good support bra for breast tenderness.   Take warm sitz baths to soothe any pain or discomfort caused by hemorrhoids. Use hemorrhoid cream if your health care provider approves.   Rest with your legs elevated if you have leg cramps or low back pain.  If you develop varicose veins in your legs, wear support hose. Elevate your feet for 15 minutes, 3-4 times a day. Limit salt in your diet. Prenatal Care  Schedule your prenatal visits by the twelfth week of pregnancy. They are usually scheduled monthly at first, then more often in the last 2 months before delivery.  Write down your questions. Take them to your prenatal visits.  Keep all your prenatal visits as directed by your   health care provider. Safety  Wear your seat belt at all times when driving.  Make a list of emergency phone numbers, including numbers for family, friends, the hospital, and police and fire departments. General Tips  Ask your health care provider for a referral to a local prenatal education class. Begin classes no later than at the beginning of month 6 of your pregnancy.  Ask for help if you have counseling or nutritional needs during pregnancy. Your health care provider can offer advice or refer you to specialists for help with various needs.  Do not use hot tubs, steam rooms, or saunas.  Do not douche or use tampons or scented sanitary pads.  Do not cross your legs for long periods of time.  Avoid cat litter boxes and soil used by cats. These carry germs that can cause birth defects in the baby and possibly loss of the fetus by miscarriage or stillbirth.  Avoid all smoking, herbs, alcohol, and medicines not prescribed by  your health care provider. Chemicals in these affect the formation and growth of the baby.  Do not use any tobacco products, including cigarettes, chewing tobacco, and electronic cigarettes. If you need help quitting, ask your health care provider. You may receive counseling support and other resources to help you quit.  Schedule a dentist appointment. At home, brush your teeth with a soft toothbrush and be gentle when you floss. SEEK MEDICAL CARE IF:   You have dizziness.  You have mild pelvic cramps, pelvic pressure, or nagging pain in the abdominal area.  You have persistent nausea, vomiting, or diarrhea.  You have a bad smelling vaginal discharge.  You have pain with urination.  You notice increased swelling in your face, hands, legs, or ankles. SEEK IMMEDIATE MEDICAL CARE IF:   You have a fever.  You are leaking fluid from your vagina.  You have spotting or bleeding from your vagina.  You have severe abdominal cramping or pain.  You have rapid weight gain or loss.  You vomit blood or material that looks like coffee grounds.  You are exposed to German measles and have never had them.  You are exposed to fifth disease or chickenpox.  You develop a severe headache.  You have shortness of breath.  You have any kind of trauma, such as from a fall or a car accident.   This information is not intended to replace advice given to you by your health care provider. Make sure you discuss any questions you have with your health care provider.   Document Released: 03/03/2001 Document Revised: 03/30/2014 Document Reviewed: 01/17/2013 Elsevier Interactive Patient Education 2016 Elsevier Inc.  

## 2015-12-21 NOTE — [facility] Provider Note (Signed)
S:  [name] is a 25 y.o. female [phone] @ 2638w4d here for a follow up beta hcg level. She was seen 2 days ago with abdominal pain. She feels the pain is related to her cesarean section scar with possibly some scar tissue. She has had C/S X4. She denies pain at this time.    O:  GENERAL: Well-developed, well-nourished female in no acute distress.  LUNGS: Effort normal SKIN: Warm, dry and without erythema PSYCH: Normal mood and affect  Vitals:   12/21/15 1749  BP: 129/70  Resp: 16  Temp: 99 F (37.2 C)    MDM:  Beta hcg 9/28: 5061 Beta hcg 9/30: 8919  60% rise in beta hcg level, patient declines pain at this time.    S:  1. Pregnancy of unknown anatomic location     P:  Discharge home in stable condition Repeat US in 7 days for viability; patient to go to the [facility] following US for results.  Ectopic precautions Return to [facility] if symptoms worsen Pelvic rest  Duane LopeJennifer I Rasch, NP 12/21/2015 7:25 PM

## 2015-12-21 NOTE — [facility]U Note (Signed)
Feeling pain at previous C/S scar right after she eats, denies vaginal bleeding.

## 2015-12-21 NOTE — MAU Note (Signed)
Venia CarbonJennifer Rasch NP in Triage to discuss test result and d/c plan with pt. PT d/c home from Triage

## 2015-12-26 ENCOUNTER — Ambulatory Visit: Payer: Medicaid Other

## 2015-12-26 ENCOUNTER — Ambulatory Visit (HOSPITAL_COMMUNITY)
Admission: RE | Admit: 2015-12-26 | Discharge: 2015-12-26 | Disposition: A | Discharge status: Home / Self Care | Payer: Medicaid Other | Source: Ambulatory Visit | Attending: Obstetrics and Gynecology | Admitting: Obstetrics and Gynecology

## 2015-12-26 DIAGNOSIS — Z3A01 Less than 8 weeks gestation of pregnancy: Secondary | ICD-10-CM | POA: Diagnosis not present

## 2015-12-26 DIAGNOSIS — O283 Abnormal ultrasonic finding on antenatal screening of mother: Secondary | ICD-10-CM | POA: Diagnosis present

## 2015-12-26 DIAGNOSIS — O3680X0 Pregnancy with inconclusive fetal viability, not applicable or unspecified: Secondary | ICD-10-CM

## 2015-12-26 DIAGNOSIS — Z3A08 8 weeks gestation of pregnancy: Secondary | ICD-10-CM

## 2015-12-26 NOTE — Progress Notes (Addendum)
Patient presented to our office today after having her transvaginal U/S. Results were given to patient that it is too early in her pregnancy to see anything.Per provider patient will be scheduled for a repeat U/S in 10 days on 01/08/2016. Patient verbalizes understanding at this time.

## 2016-01-06 ENCOUNTER — Ambulatory Visit (INDEPENDENT_AMBULATORY_CARE_PROVIDER_SITE_OTHER): Payer: Medicaid Other | Admitting: Advanced Practice Midwife

## 2016-01-06 ENCOUNTER — Encounter: Payer: Self-pay | Admitting: *Deleted

## 2016-01-06 ENCOUNTER — Encounter: Payer: Self-pay | Admitting: Advanced Practice Midwife

## 2016-01-06 ENCOUNTER — Other Ambulatory Visit (HOSPITAL_COMMUNITY)
Admission: RE | Admit: 2016-01-06 | Discharge: 2016-01-06 | Disposition: A | Discharge status: Home / Self Care | Payer: Medicaid Other | Source: Ambulatory Visit | Attending: Advanced Practice Midwife | Admitting: Advanced Practice Midwife

## 2016-01-06 VITALS — BP 122/82 | HR 66 | Wt 128.0 lb

## 2016-01-06 DIAGNOSIS — Z3491 Encounter for supervision of normal pregnancy, unspecified, first trimester: Secondary | ICD-10-CM

## 2016-01-06 DIAGNOSIS — Z113 Encounter for screening for infections with a predominantly sexual mode of transmission: Secondary | ICD-10-CM | POA: Diagnosis present

## 2016-01-06 DIAGNOSIS — T8189XS Other complications of procedures, not elsewhere classified, sequela: Secondary | ICD-10-CM

## 2016-01-06 DIAGNOSIS — Z3689 Encounter for other specified antenatal screening: Secondary | ICD-10-CM | POA: Diagnosis not present

## 2016-01-06 DIAGNOSIS — O219 Vomiting of pregnancy, unspecified: Secondary | ICD-10-CM

## 2016-01-06 DIAGNOSIS — O34219 Maternal care for unspecified type scar from previous cesarean delivery: Secondary | ICD-10-CM

## 2016-01-06 DIAGNOSIS — O909 Complication of the puerperium, unspecified: Secondary | ICD-10-CM

## 2016-01-06 DIAGNOSIS — Z789 Other specified health status: Secondary | ICD-10-CM

## 2016-01-06 DIAGNOSIS — I82409 Acute embolism and thrombosis of unspecified deep veins of unspecified lower extremity: Secondary | ICD-10-CM

## 2016-01-06 DIAGNOSIS — Z3481 Encounter for supervision of other normal pregnancy, first trimester: Secondary | ICD-10-CM | POA: Diagnosis not present

## 2016-01-06 DIAGNOSIS — Z981 Arthrodesis status: Secondary | ICD-10-CM

## 2016-01-06 LAB — GLUCOSE, RANDOM: GLUCOSE: 85 mg/dL (ref 65–99)

## 2016-01-06 LAB — HEMOGLOBIN A1C
HEMOGLOBIN A1C: 4.8 % (ref <5.7)
MEAN PLASMA GLUCOSE: 91 mg/dL

## 2016-01-06 MED ORDER — PROMETHAZINE HCL 25 MG PO TABS: 25.0000 mg | ORAL_TABLET | Freq: Four times a day (QID) | ORAL | 4 refills | Status: DC | PRN

## 2016-01-06 NOTE — Progress Notes (Signed)
Subjective:    Melinda Higgins is a [id number] 7838w6d by LMP and US today being seen today for her first obstetrical visit.  Her obstetrical history is significant for previous C/S x 3. First C/S done due to pt having spinal fusion. Second and third were planned repeats. Had complicated wound healing w/ last one--hematomas, wound dehiscence that took 6 months to heal completely. Wants TOLAC, but understand if our practice is not comfortable. Had difficult Spinal anesthesia placement w/ first two. W/ 3rd C/S anesthesiologist reviewed previous records and was able to place spinal w/out difficulty. Records under [facility]. Patient does intend to breast feed. Currently breastfeeding 25 year-old Pregnancy history fully reviewed.  Was seen in [facility] end of September 2017 for pelvic pain. Wet prep, cultures done. US showed GS only. US today shows live IUP. Last Pap 12/2014. Normal per pt.   Patient reports nausea and vomiting. Some improvement w/ Phenergan, but ran out. Some improvement w/ B6, Ginger. Diclegis and Zofran not helping.   Vitals:   01/06/16 1021  BP: 122/82  Pulse: 66  Weight: 128 lb (58.1 kg)    HISTORY: OB History  Gravida Para Term Preterm AB Living  5 3 3   1 3   SAB TAB Ectopic Multiple Live Births  1       3    # Outcome Date GA Lbr Len/2nd Weight Sex Delivery Anes PTL Lv  5 Current           4 SAB           3 Term      CS-LTranv  N LIV     Birth Comments: System Generated. Please review and update pregnancy details.  2 Term      CS-LTranv   LIV  1 Term      CS-LTranv   LIV     Past Medical History:  Diagnosis Date  . Anemia   . Asthma    Past Surgical History:  Procedure Laterality Date  . BACK SURGERY     rods put in back  . CESAREAN SECTION     3 times  . EYE SURGERY     Family History  Problem Relation Age of Onset  . Diabetes    . Heart disease    . Diabetes Mother   . Heart disease Mother      Exam    Uterus:     Pelvic Exam:    Perineum:  Normal Perineum   Vulva: normal   Bony Pelvis: unproven  System: Breast:  declined   Skin: normal coloration and turgor, no rashes    Neurologic: oriented, normal mood, grossly non-focal   Extremities: normal strength, tone, and muscle mass   HEENT sclera clear, anicteric and thyroid without masses   Mouth/Teeth mucous membranes moist, pharynx normal without lesions   Neck supple and no masses   Cardiovascular: regular rate and rhythm, no murmurs or gallops   Respiratory:  appears well, vitals normal, no respiratory distress, acyanotic, normal RR, chest clear, no wheezing, crepitations, rhonchi, normal symmetric air entry   Abdomen: soft, non-tender; bowel sounds normal; no masses,  no organomegaly      Assessment:    Pregnancy: [id number] Patient Active Problem List   Diagnosis Date Noted  . Normal pregnancy in first trimester 01/06/2016  . Nausea and vomiting of pregnancy, antepartum 01/06/2016  . Previous cesarean delivery affecting pregnancy, antepartum 01/06/2016   1. Normal pregnancy in first trimester  - Prenatal Profile -  HgB A1c - CULTURE, URINE COMPREHENSIVE - Glucose - Urine cytology ancillary only  2. Nausea and vomiting of pregnancy, antepartum  - promethazine (PHENERGAN) 25 MG tablet; Take 1 tablet (25 mg total) by mouth every 6 (six) hours as needed for nausea or vomiting.  Dispense: 6 tablet; Refill: 4  3. Previous cesarean delivery affecting pregnancy, antepartum - Explained that I will need to discuss the pt's desires for TOLAC with the other providers, but that they are not likely to be comfortable w/ TOLAC after 3 C/S. If pt has C/S she would be a good candidate for prophylactic wound vac.  4. Encounter for supervision of other normal pregnancy in first trimester  5. Hx of spinal fusion  - Will arrange anesthesia consult due discuss previous spinal anesthesia experiences and have them review her records ahead of time.     Plan:     Initial labs  drawn. Prenatal vitamins. Problem list reviewed and updated. Genetic Screening discussed First Screen: declined all. Ultrasound discussed; fetal survey: requested. Follow up in 4 weeks. Baby Rx.    [name] 01/06/2016   After pt left Care Everywhere records were reviewed. Pt had DVT after spinal fusion in 2005. Was given Lovenox PP C/S 2015. Will need to discuss further at [place]. Per ACOG if she only had the one DVT provoked by surgery and not estrogen/pregnancy and does not have a thrombophilia, she will not need anticoagulation during pregnancy, but should PP. Lovenox given PP may have contributed to hematomas. Would probably be a good candidate for prophylactic wound vac after next C/S.

## 2016-01-06 NOTE — Patient Instructions (Signed)
Eating Plan for Hyperemesis Gravidarum °Severe cases of hyperemesis gravidarum can lead to dehydration and malnutrition. The hyperemesis eating plan is one way to lessen the symptoms of nausea and vomiting. It is often used with prescribed medicines to control your symptoms.  °WHAT CAN I DO TO RELIEVE MY SYMPTOMS? °Listen to your body. Everyone is different and has different preferences. Find what works best for you. Some of the following things may help: °· Eat and drink slowly. °· Eat 5-6 small meals daily instead of 3 large meals.   °· Eat crackers before you get out of bed in the morning.   °· Starchy foods are usually well tolerated (such as cereal, toast, bread, potatoes, pasta, rice, and pretzels).   °· Ginger may help with nausea. Add ¼ tsp ground ginger to hot tea or choose ginger tea.   °· Try drinking 100% fruit juice or an electrolyte drink. °· Continue to take your prenatal vitamins as directed by your health care provider. If you are having trouble taking your prenatal vitamins, talk with your health care provider about different options. °· Include at least 1 serving of protein with your meals and snacks (such as meats or poultry, beans, nuts, eggs, or yogurt). Try eating a protein-rich snack before bed (such as cheese and crackers or a half turkey or peanut butter sandwich). °WHAT THINGS SHOULD I AVOID TO REDUCE MY SYMPTOMS? °The following things may help reduce your symptoms: °· Avoid foods with strong smells. Try eating meals in well-ventilated areas that are free of odors. °· Avoid drinking water or other beverages with meals. Try not to drink anything less than 30 minutes before and after meals. °· Avoid drinking more than 1 cup of fluid at a time. °· Avoid fried or high-fat foods, such as butter and cream sauces. °· Avoid spicy foods. °· Avoid skipping meals the best you can. Nausea can be more intense on an empty stomach. If you cannot tolerate food at that time, do not force it. Try sucking on  ice chips or other frozen items and make up the calories later. °· Avoid lying down within 2 hours after eating. °  °This information is not intended to replace advice given to you by your health care provider. Make sure you discuss any questions you have with your health care provider. °  °Document Released: 01/04/2007 Document Revised: 03/14/2013 Document Reviewed: 01/11/2013 °Elsevier Interactive Patient Education ©2016 Elsevier Inc. ° °

## 2016-01-06 NOTE — Progress Notes (Signed)
Bedside U/S shows IUP with FHT of 155 BPM and CRL is 10.678mm  GA is 8323w2d

## 2016-01-07 LAB — PRENATAL PROFILE (SOLSTAS)
Antibody Screen: NEGATIVE
BASOS ABS: 0 {cells}/uL (ref 0–200)
Basophils Relative: 0 %
EOS ABS: 204 {cells}/uL (ref 15–500)
Eosinophils Relative: 2 %
HCT: 35.4 % (ref 35.0–45.0)
HEP B S AG: NEGATIVE
HIV: REACTIVE — AB
Hemoglobin: 11.9 g/dL (ref 11.7–15.5)
LYMPHS ABS: 2040 {cells}/uL (ref 850–3900)
LYMPHS PCT: 20 %
MCH: 27.6 pg (ref 27.0–33.0)
MCHC: 33.6 g/dL (ref 32.0–36.0)
MCV: 82.1 fL (ref 80.0–100.0)
MONO ABS: 612 {cells}/uL (ref 200–950)
MPV: 11.3 fL (ref 7.5–12.5)
Monocytes Relative: 6 %
NEUTROS PCT: 72 %
Neutro Abs: 7344 cells/uL (ref 1500–7800)
Platelets: 250 10*3/uL (ref 140–400)
RBC: 4.31 MIL/uL (ref 3.80–5.10)
RDW: 14.7 % (ref 11.0–15.0)
RH TYPE: POSITIVE
RUBELLA: 3.28 {index} — AB (ref <0.90)
WBC: 10.2 10*3/uL (ref 3.8–10.8)

## 2016-01-07 LAB — HIV 1/2 CONFIRMATION
HIV 1 ANTIBODY: NEGATIVE
HIV 2 AB: NEGATIVE

## 2016-01-08 ENCOUNTER — Telehealth: Payer: Self-pay | Admitting: Infectious Disease

## 2016-01-08 ENCOUNTER — Ambulatory Visit (HOSPITAL_COMMUNITY): Payer: Medicaid Other | Attending: Obstetrics & Gynecology

## 2016-01-08 DIAGNOSIS — Z789 Other specified health status: Secondary | ICD-10-CM | POA: Insufficient documentation

## 2016-01-08 LAB — URINE CYTOLOGY ANCILLARY ONLY
CHLAMYDIA, DNA PROBE: NEGATIVE
Neisseria Gonorrhea: NEGATIVE

## 2016-01-08 LAB — CULTURE, URINE COMPREHENSIVE: Organism ID, Bacteria: NO GROWTH

## 2016-01-08 NOTE — Telephone Encounter (Signed)
HIV first test is + but I predict it is a false + . If not then this is ACUTE HIV in pregnancy and patient will need to get on meds ASAP

## 2016-01-12 LAB — HIV-1 RNA, QUALITATIVE, TMA: HIV-1 RNA, Qualitative, TMA: NOT DETECTED

## 2016-01-15 NOTE — Telephone Encounter (Signed)
That is a relief! 

## 2016-01-15 NOTE — Telephone Encounter (Signed)
HIV antibody completed as negative as well as HIV RNA

## 2016-01-20 ENCOUNTER — Other Ambulatory Visit: Payer: Self-pay | Admitting: *Deleted

## 2016-01-20 DIAGNOSIS — Z3491 Encounter for supervision of normal pregnancy, unspecified, first trimester: Secondary | ICD-10-CM

## 2016-01-20 DIAGNOSIS — O219 Vomiting of pregnancy, unspecified: Secondary | ICD-10-CM

## 2016-01-20 MED ORDER — ONDANSETRON 4 MG PO TBDP: 4.0000 mg | ORAL_TABLET | Freq: Three times a day (TID) | ORAL | 0 refills | Status: DC | PRN

## 2016-01-20 NOTE — Telephone Encounter (Signed)
Pt called stating that she had ran out of her ofran 4 mg and was requesting a RF.  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  OK'd per Dr Marice Potterove.

## 2016-01-28 ENCOUNTER — Ambulatory Visit (INDEPENDENT_AMBULATORY_CARE_PROVIDER_SITE_OTHER): Payer: Medicaid Other | Admitting: Obstetrics & Gynecology

## 2016-01-28 VITALS — BP 131/83 | HR 70 | Wt 131.0 lb

## 2016-01-28 DIAGNOSIS — Z3491 Encounter for supervision of normal pregnancy, unspecified, first trimester: Secondary | ICD-10-CM

## 2016-01-28 DIAGNOSIS — O34219 Maternal care for unspecified type scar from previous cesarean delivery: Secondary | ICD-10-CM

## 2016-01-28 NOTE — Progress Notes (Signed)
   PRENATAL VISIT NOTE  Subjective:  Melinda Higgins is a 25 y.o. [phone] at 7727w0d being seen today for ongoing prenatal care.  She is currently monitored for the following issues for this low-risk pregnancy and has Normal pregnancy in first trimester; Nausea and vomiting of pregnancy, antepartum; Previous cesarean delivery affecting pregnancy, antepartum; Postoperative deep vein thrombosis (DVT) (HCC); False positive HIV serology; Cesarean section wound complication; History of spinal fusion; Spondylolisthesis; and Asthma on her problem list.  Patient reports a small tender knot on her left forearm, had been swollen 4 weeks ago..  Contractions: Not present. Vag. Bleeding: None.   . Denies leaking of fluid.   The following portions of the patient's history were reviewed and updated as appropriate: allergies, current medications, past family history, past medical history, past social history, past surgical history and problem list. Problem list updated.  Objective:   Vitals:   01/28/16 1416  BP: 131/83  Pulse: 70  Weight: 131 lb (59.4 kg)    Fetal Status: Fetal Heart Rate (bpm): 144         General:  Alert, oriented and cooperative. Patient is in no acute distress.  Skin: Skin is warm and dry. No rash noted.   Cardiovascular: Normal heart rate noted  Respiratory: Normal respiratory effort, no problems with respiration noted  Abdomen: Soft, gravid, appropriate for gestational age. Pain/Pressure: Absent     Pelvic:  Cervical exam deferred        Extremities: Normal range of motion.  Edema: None  Mental Status: Normal mood and affect. Normal behavior. Normal judgment and thought content.  Her left forearm has a small (about 2 mm) tender knot over a superficial vein. Reassurance given. Assessment and Plan:  Pregnancy: [id number] at 3827w0d  1. Previous cesarean delivery affecting pregnancy, antepartum - She is agreeable to have this baby delivered via RC/S.  2. Normal pregnancy in first  trimester - Schedule anatomy u/s for about [redacted] weeks EGA  Preterm labor symptoms and general obstetric precautions including but not limited to vaginal bleeding, contractions, leaking of fluid and fetal movement were reviewed in detail with the patient. Please refer to After Visit Summary for other counseling recommendations.  No Follow-up on file.   Melinda BossierMyra C Royalty Domagala, MD

## 2016-01-28 NOTE — Progress Notes (Signed)
Painful  knot on Left forearm. H/O blood clot

## 2016-02-07 ENCOUNTER — Encounter: Payer: Medicaid Other | Admitting: Family

## 2016-02-11 ENCOUNTER — Ambulatory Visit (INDEPENDENT_AMBULATORY_CARE_PROVIDER_SITE_OTHER): Payer: Medicaid Other | Admitting: Obstetrics & Gynecology

## 2016-02-11 VITALS — BP 124/75 | HR 84 | Temp 97.0°F | Wt 131.0 lb

## 2016-02-11 DIAGNOSIS — Z8759 Personal history of other complications of pregnancy, childbirth and the puerperium: Secondary | ICD-10-CM

## 2016-02-11 DIAGNOSIS — Z86718 Personal history of other venous thrombosis and embolism: Secondary | ICD-10-CM

## 2016-02-11 DIAGNOSIS — Z3492 Encounter for supervision of normal pregnancy, unspecified, second trimester: Secondary | ICD-10-CM

## 2016-02-11 DIAGNOSIS — Z3491 Encounter for supervision of normal pregnancy, unspecified, first trimester: Secondary | ICD-10-CM

## 2016-02-11 MED ORDER — ENOXAPARIN SODIUM 30 MG/0.3ML ~~LOC~~ SOLN: 30.0000 mg | SUBCUTANEOUS | 30 refills | Status: DC

## 2016-02-11 MED ORDER — AZITHROMYCIN 250 MG PO TABS: ORAL_TABLET | ORAL | 0 refills | Status: DC

## 2016-02-11 NOTE — Progress Notes (Signed)
See md note

## 2016-02-11 NOTE — Progress Notes (Signed)
Pt here with c/o's clot in arm.  She has a h/o blood clots and was put on Lovenox in previous pregnancies.  Would like a referral to MFM.  Pt has sinus infection today with green discharge

## 2016-02-11 NOTE — Progress Notes (Signed)
   PRENATAL VISIT NOTE  Subjective:  Melinda Higgins is a 25 y.o. [phone] at 4156w0d being seen today for ongoing prenatal care. This in an unscheduled visit due to a sinus infection and another superficial blood clot. She would like a MFM referral to discuss possible lovenox during pregnancy.  She is currently monitored for the following issues for this high-risk pregnancy and has Normal pregnancy in first trimester; Nausea and vomiting of pregnancy, antepartum; Previous cesarean delivery affecting pregnancy, antepartum; Postoperative deep vein thrombosis (DVT) (HCC); False positive HIV serology; Cesarean section wound complication; History of spinal fusion; Spondylolisthesis; and Asthma on her problem list.  Patient reports no complaints.  Contractions: Not present. Vag. Bleeding: None.   . Denies leaking of fluid.   The following portions of the patient's history were reviewed and updated as appropriate: allergies, current medications, past family history, past medical history, past social history, past surgical history and problem list. Problem list updated.  Objective:   Vitals:   02/11/16 1135  BP: 124/75  Pulse: 84  Temp: 97 F (36.1 C)  Weight: 131 lb (59.4 kg)    Fetal Status:           General:  Alert, oriented and cooperative. Patient is in no acute distress.  Skin: Skin is warm and dry. No rash noted.   Cardiovascular: Normal heart rate noted  Respiratory: Normal respiratory effort, no problems with respiration noted  Abdomen: Soft, gravid, appropriate for gestational age. Pain/Pressure: Absent     Pelvic:  Cervical exam deferred        Extremities: Normal range of motion.  Edema: None  Mental Status: Normal mood and affect. Normal behavior. Normal judgment and thought content.   Assessment and Plan:  Pregnancy: [id number] at 6056w0d  1. History of maternal deep vein thrombosis (DVT)  - AMB referral to maternal fetal medicine - Z pack prescribed Preterm labor symptoms and  general obstetric precautions including but not limited to vaginal bleeding, contractions, leaking of fluid and fetal movement were reviewed in detail with the patient. Please refer to After Visit Summary for other counseling recommendations.  No Follow-up on file.   Allie BossierMyra C Lorynn Moeser, MD

## 2016-03-09 ENCOUNTER — Encounter (HOSPITAL_COMMUNITY): Payer: Self-pay | Admitting: Obstetrics & Gynecology

## 2016-03-13 ENCOUNTER — Encounter: Payer: Self-pay | Admitting: Family

## 2016-03-13 ENCOUNTER — Ambulatory Visit (INDEPENDENT_AMBULATORY_CARE_PROVIDER_SITE_OTHER): Payer: Medicaid Other | Admitting: Family

## 2016-03-13 DIAGNOSIS — I82409 Acute embolism and thrombosis of unspecified deep veins of unspecified lower extremity: Secondary | ICD-10-CM

## 2016-03-13 DIAGNOSIS — O34219 Maternal care for unspecified type scar from previous cesarean delivery: Secondary | ICD-10-CM

## 2016-03-13 DIAGNOSIS — O0992 Supervision of high risk pregnancy, unspecified, second trimester: Secondary | ICD-10-CM

## 2016-03-13 DIAGNOSIS — O0990 Supervision of high risk pregnancy, unspecified, unspecified trimester: Secondary | ICD-10-CM

## 2016-03-13 DIAGNOSIS — T8189XS Other complications of procedures, not elsewhere classified, sequela: Secondary | ICD-10-CM

## 2016-03-13 NOTE — Progress Notes (Signed)
   PRENATAL VISIT NOTE  Subjective:  Melinda Higgins is a 25 y.o. [phone] at 667w3d being seen today for ongoing prenatal care.  She is currently monitored for the following issues for this high-risk pregnancy and has Supervision of high risk pregnancy, antepartum; Nausea and vomiting of pregnancy, antepartum; Previous cesarean delivery affecting pregnancy, antepartum; Postoperative deep vein thrombosis (DVT) (HCC); False positive HIV serology; Cesarean section wound complication; History of spinal fusion; Spondylolisthesis; and Asthma on her problem list.  Patient reports improvement in clots on arm.  Contractions: Not present. Vag. Bleeding: None.  Movement: Present. Denies leaking of fluid.   The following portions of the patient's history were reviewed and updated as appropriate: allergies, current medications, past family history, past medical history, past social history, past surgical history and problem list. Problem list updated.  Objective:   Vitals:   03/13/16 1044  BP: 112/70  Pulse: 70  Weight: 138 lb (62.6 kg)    Fetal Status: Fetal Heart Rate (bpm): 146 Fundal Height: 17 cm Movement: Present     General:  Alert, oriented and cooperative. Patient is in no acute distress.  Skin: Skin is warm and dry. No rash noted.   Cardiovascular: Normal heart rate noted  Respiratory: Normal respiratory effort, no problems with respiration noted  Abdomen: Soft, gravid, appropriate for gestational age. Pain/Pressure: Present     Pelvic:  Cervical exam deferred        Extremities: Normal range of motion.  Edema: None  Mental Status: Normal mood and affect. Normal behavior. Normal judgment and thought content.   Assessment and Plan:  Pregnancy: [id number] at 237w3d  1. Supervision of high risk pregnancy, antepartum - Reviewed new OB lab results - Anatomy ultrasound scheduled  2. Previous cesarean delivery affecting pregnancy, antepartum - Patient is fine with repeat csection at this  time  3. Postoperative deep vein thrombosis (DVT), sequela (HCC) - Appt scheduled with MFM, currently on Lovenox  Preterm labor symptoms and general obstetric precautions including but not limited to vaginal bleeding, contractions, leaking of fluid and fetal movement were reviewed in detail with the patient. Please refer to After Visit Summary for other counseling recommendations.  Return in about 4 weeks (around 04/10/2016).   Eino FarberWalidah Kennith GainN Karim, CNM

## 2016-03-19 ENCOUNTER — Encounter (HOSPITAL_COMMUNITY): Payer: Self-pay

## 2016-03-24 ENCOUNTER — Ambulatory Visit (HOSPITAL_COMMUNITY)
Admission: RE | Admit: 2016-03-24 | Discharge: 2016-03-24 | Disposition: A | Discharge status: Home / Self Care | Payer: Medicaid Other | Source: Ambulatory Visit | Attending: Obstetrics & Gynecology | Admitting: Obstetrics & Gynecology

## 2016-03-24 ENCOUNTER — Encounter (HOSPITAL_COMMUNITY): Payer: Self-pay

## 2016-03-24 VITALS — BP 123/69 | HR 72 | Wt 140.0 lb

## 2016-03-24 DIAGNOSIS — Z8759 Personal history of other complications of pregnancy, childbirth and the puerperium: Secondary | ICD-10-CM

## 2016-03-24 DIAGNOSIS — Z3491 Encounter for supervision of normal pregnancy, unspecified, first trimester: Secondary | ICD-10-CM

## 2016-03-24 DIAGNOSIS — Z86718 Personal history of other venous thrombosis and embolism: Secondary | ICD-10-CM

## 2016-03-24 DIAGNOSIS — Z363 Encounter for antenatal screening for malformations: Secondary | ICD-10-CM | POA: Diagnosis present

## 2016-03-24 DIAGNOSIS — O09292 Supervision of pregnancy with other poor reproductive or obstetric history, second trimester: Secondary | ICD-10-CM

## 2016-03-24 DIAGNOSIS — Z3A19 19 weeks gestation of pregnancy: Secondary | ICD-10-CM | POA: Insufficient documentation

## 2016-03-24 DIAGNOSIS — Z7901 Long term (current) use of anticoagulants: Secondary | ICD-10-CM | POA: Insufficient documentation

## 2016-03-24 DIAGNOSIS — O34211 Maternal care for low transverse scar from previous cesarean delivery: Secondary | ICD-10-CM | POA: Diagnosis not present

## 2016-03-24 NOTE — Progress Notes (Signed)
Maternal Fetal Medicine Consultation  Requesting Provider(s): Dove  Primary Ob: Dove Reason for consultation: Multiple DVTs, previous child with pyelectasis  HPI: 89 yo P3013 now at 19 weeks, referred for history of DVT. She had a spinal fusion at age 26 and suffered a lower extremity DVT. She had been placed on post-cesarean thromboprophylaxis with Lovenox after her third C/S and developed an RUE DVT. This necessitated therapeutic range Lovenox dosages, which lead to significant wound hematomas and dehiscence. She had a prolonged healing and recovery phase. She is currently on 30mg  of Lovenox per day with no complications.  OB History: OB History    Gravida Para Term Preterm AB Living   5 3 3   1 3    SAB TAB Ectopic Multiple Live Births   1       3      PMH:  Past Medical History:  Diagnosis Date  . Anemia   . Asthma   . Postoperative deep vein thrombosis (DVT) (HCC) 2005   after spinal surgery    PSH:  Past Surgical History:  Procedure Laterality Date  . BACK SURGERY     rods put in back  . CESAREAN SECTION     3 times  . EYE SURGERY     Meds: See EPIC section Allergies: Chlorpheniramine, benadryl, soy and peanuts Fh: See EPIC section. Her last child had pyelectasis that required surgical intervention, as did her husband Soc: Denies alcohol, tobacco and illicit drug use  Review of Systems: no vaginal bleeding or cramping/contractions, no LOF, no nausea/vomiting. All other systems reviewed and are negative.  PE: See EPIC section  Please see separate document for fetal ultrasound report.  A/P: 1. History of multiple DVT; I agree with the current Lovenox regimen, and this should be continued throught the 6 week postpartum period. You have already discussed wound care interventions, and I agree with prophylactic WoundVac application at closure 2. Previous child with pyelectasis. See US report for details, but there is no evidence of anomalies. I would recommend repeat  evaluations to assess for pyelectasis at 28 and 36 weeks. These can be done here, or at your office, at your discretion  Thank you for the opportunity to be a part of the care of Deere & CompanyShannon Ramone. Please contact our office if we can be of further assistance.   I spent approximately 30 minutes with this patient with over 50% of time spent in face-to-face counseling.

## 2016-04-01 ENCOUNTER — Other Ambulatory Visit: Payer: Self-pay | Admitting: *Deleted

## 2016-04-01 DIAGNOSIS — O219 Vomiting of pregnancy, unspecified: Secondary | ICD-10-CM

## 2016-04-01 MED ORDER — PROMETHAZINE HCL 25 MG PO TABS: 25.0000 mg | ORAL_TABLET | Freq: Four times a day (QID) | ORAL | 0 refills | Status: DC | PRN

## 2016-04-02 ENCOUNTER — Other Ambulatory Visit: Payer: Self-pay | Admitting: *Deleted

## 2016-04-02 DIAGNOSIS — O219 Vomiting of pregnancy, unspecified: Secondary | ICD-10-CM

## 2016-04-02 MED ORDER — PROMETHAZINE HCL 25 MG PO TABS: 25.0000 mg | ORAL_TABLET | Freq: Four times a day (QID) | ORAL | 0 refills | Status: DC | PRN

## 2016-04-03 ENCOUNTER — Ambulatory Visit (INDEPENDENT_AMBULATORY_CARE_PROVIDER_SITE_OTHER): Payer: Medicaid Other | Admitting: Advanced Practice Midwife

## 2016-04-03 ENCOUNTER — Encounter: Payer: Self-pay | Admitting: Advanced Practice Midwife

## 2016-04-03 VITALS — BP 128/77 | HR 73 | Wt 141.0 lb

## 2016-04-03 DIAGNOSIS — O99519 Diseases of the respiratory system complicating pregnancy, unspecified trimester: Secondary | ICD-10-CM

## 2016-04-03 DIAGNOSIS — Z362 Encounter for other antenatal screening follow-up: Secondary | ICD-10-CM

## 2016-04-03 DIAGNOSIS — J45909 Unspecified asthma, uncomplicated: Secondary | ICD-10-CM

## 2016-04-03 DIAGNOSIS — O219 Vomiting of pregnancy, unspecified: Secondary | ICD-10-CM

## 2016-04-03 DIAGNOSIS — O0990 Supervision of high risk pregnancy, unspecified, unspecified trimester: Secondary | ICD-10-CM

## 2016-04-03 DIAGNOSIS — IMO0002 Reserved for concepts with insufficient information to code with codable children: Secondary | ICD-10-CM

## 2016-04-03 DIAGNOSIS — T8189XS Other complications of procedures, not elsewhere classified, sequela: Secondary | ICD-10-CM

## 2016-04-03 DIAGNOSIS — O34219 Maternal care for unspecified type scar from previous cesarean delivery: Secondary | ICD-10-CM

## 2016-04-03 DIAGNOSIS — Z8279 Family history of other congenital malformations, deformations and chromosomal abnormalities: Secondary | ICD-10-CM

## 2016-04-03 DIAGNOSIS — J4540 Moderate persistent asthma, uncomplicated: Secondary | ICD-10-CM

## 2016-04-03 DIAGNOSIS — O909 Complication of the puerperium, unspecified: Secondary | ICD-10-CM

## 2016-04-03 DIAGNOSIS — I82409 Acute embolism and thrombosis of unspecified deep veins of unspecified lower extremity: Secondary | ICD-10-CM

## 2016-04-03 DIAGNOSIS — Z0489 Encounter for examination and observation for other specified reasons: Secondary | ICD-10-CM

## 2016-04-03 HISTORY — DX: Family history of other congenital malformations, deformations and chromosomal abnormalities: Z82.79

## 2016-04-03 MED ORDER — MONTELUKAST SODIUM 10 MG PO TABS: 10.0000 mg | ORAL_TABLET | Freq: Every day | ORAL | 6 refills | Status: DC

## 2016-04-03 MED ORDER — FLUTICASONE-SALMETEROL 100-50 MCG/DOSE IN AEPB: 1.0000 | INHALATION_SPRAY | Freq: Two times a day (BID) | RESPIRATORY_TRACT | 3 refills | Status: DC

## 2016-04-03 MED ORDER — ENOXAPARIN SODIUM 30 MG/0.3ML ~~LOC~~ SOLN: 30.0000 mg | SUBCUTANEOUS | 6 refills | Status: DC

## 2016-04-03 MED ORDER — PROMETHAZINE HCL 25 MG PO TABS: 25.0000 mg | ORAL_TABLET | Freq: Four times a day (QID) | ORAL | 2 refills | Status: DC | PRN

## 2016-04-03 NOTE — Progress Notes (Signed)
Unable to collect specimen.

## 2016-04-03 NOTE — Patient Instructions (Signed)
Breastfeeding Deciding to breastfeed is one of the best choices you can make for you and your baby. A change in hormones during pregnancy causes your breast tissue to grow and increases the number and size of your milk ducts. These hormones also allow proteins, sugars, and fats from your blood supply to make breast milk in your milk-producing glands. Hormones prevent breast milk from being released before your baby is born as well as prompt milk flow after birth. Once breastfeeding has begun, thoughts of your baby, as well as his or her sucking or crying, can stimulate the release of milk from your milk-producing glands. Benefits of breastfeeding For Your Baby  Your first milk (colostrum) helps your baby's digestive system function better.  There are antibodies in your milk that help your baby fight off infections.  Your baby has a lower incidence of asthma, allergies, and sudden infant death syndrome.  The nutrients in breast milk are better for your baby than infant formulas and are designed uniquely for your baby's needs.  Breast milk improves your baby's brain development.  Your baby is less likely to develop other conditions, such as childhood obesity, asthma, or type 2 diabetes mellitus.  For You  Breastfeeding helps to create a very special bond between you and your baby.  Breastfeeding is convenient. Breast milk is always available at the correct temperature and costs nothing.  Breastfeeding helps to burn calories and helps you lose the weight gained during pregnancy.  Breastfeeding makes your uterus contract to its prepregnancy size faster and slows bleeding (lochia) after you give birth.  Breastfeeding helps to lower your risk of developing type 2 diabetes mellitus, osteoporosis, and breast or ovarian cancer later in life.  Signs that your baby is hungry Early Signs of Hunger  Increased alertness or activity.  Stretching.  Movement of the head from side to  side.  Movement of the head and opening of the mouth when the corner of the mouth or cheek is stroked (rooting).  Increased sucking sounds, smacking lips, cooing, sighing, or squeaking.  Hand-to-mouth movements.  Increased sucking of fingers or hands.  Late Signs of Hunger  Fussing.  Intermittent crying.  Extreme Signs of Hunger Signs of extreme hunger will require calming and consoling before your baby will be able to breastfeed successfully. Do not wait for the following signs of extreme hunger to occur before you initiate breastfeeding:  Restlessness.  A loud, strong cry.  Screaming.  Breastfeeding basics Breastfeeding Initiation  Find a comfortable place to sit or lie down, with your neck and back well supported.  Place a pillow or rolled up blanket under your baby to bring him or her to the level of your breast (if you are seated). Nursing pillows are specially designed to help support your arms and your baby while you breastfeed.  Make sure that your baby's abdomen is facing your abdomen.  Gently massage your breast. With your fingertips, massage from your chest wall toward your nipple in a circular motion. This encourages milk flow. You may need to continue this action during the feeding if your milk flows slowly.  Support your breast with 4 fingers underneath and your thumb above your nipple. Make sure your fingers are well away from your nipple and your baby's mouth.  Stroke your baby's lips gently with your finger or nipple.  When your baby's mouth is open wide enough, quickly bring your baby to your breast, placing your entire nipple and as much of the colored area   around your nipple (areola) as possible into your baby's mouth. ? More areola should be visible above your baby's upper lip than below the lower lip. ? Your baby's tongue should be between his or her lower gum and your breast.  Ensure that your baby's mouth is correctly positioned around your nipple  (latched). Your baby's lips should create a seal on your breast and be turned out (everted).  It is common for your baby to suck about 2-3 minutes in order to start the flow of breast milk.  Latching Teaching your baby how to latch on to your breast properly is very important. An improper latch can cause nipple pain and decreased milk supply for you and poor weight gain in your baby. Also, if your baby is not latched onto your nipple properly, he or she may swallow some air during feeding. This can make your baby fussy. Burping your baby when you switch breasts during the feeding can help to get rid of the air. However, teaching your baby to latch on properly is still the best way to prevent fussiness from swallowing air while breastfeeding. Signs that your baby has successfully latched on to your nipple:  Silent tugging or silent sucking, without causing you pain.  Swallowing heard between every 3-4 sucks.  Muscle movement above and in front of his or her ears while sucking.  Signs that your baby has not successfully latched on to nipple:  Sucking sounds or smacking sounds from your baby while breastfeeding.  Nipple pain.  If you think your baby has not latched on correctly, slip your finger into the corner of your baby's mouth to break the suction and place it between your baby's gums. Attempt breastfeeding initiation again. Signs of Successful Breastfeeding Signs from your baby:  A gradual decrease in the number of sucks or complete cessation of sucking.  Falling asleep.  Relaxation of his or her body.  Retention of a small amount of milk in his or her mouth.  Letting go of your breast by himself or herself.  Signs from you:  Breasts that have increased in firmness, weight, and size 1-3 hours after feeding.  Breasts that are softer immediately after breastfeeding.  Increased milk volume, as well as a change in milk consistency and color by the fifth day of  breastfeeding.  Nipples that are not sore, cracked, or bleeding.  Signs That Your Baby is Getting Enough Milk  Wetting at least 1-2 diapers during the first 24 hours after birth.  Wetting at least 5-6 diapers every 24 hours for the first week after birth. The urine should be clear or pale yellow by 5 days after birth.  Wetting 6-8 diapers every 24 hours as your baby continues to grow and develop.  At least 3 stools in a 24-hour period by age 5 days. The stool should be soft and yellow.  At least 3 stools in a 24-hour period by age 7 days. The stool should be seedy and yellow.  No loss of weight greater than 10% of birth weight during the first 3 days of age.  Average weight gain of 4-7 ounces (113-198 g) per week after age 4 days.  Consistent daily weight gain by age 5 days, without weight loss after the age of 2 weeks.  After a feeding, your baby may spit up a small amount. This is common. Breastfeeding frequency and duration Frequent feeding will help you make more milk and can prevent sore nipples and breast engorgement. Breastfeed when   you feel the need to reduce the fullness of your breasts or when your baby shows signs of hunger. This is called "breastfeeding on demand." Avoid introducing a pacifier to your baby while you are working to establish breastfeeding (the first 4-6 weeks after your baby is born). After this time you may choose to use a pacifier. Research has shown that pacifier use during the first year of a baby's life decreases the risk of sudden infant death syndrome (SIDS). Allow your baby to feed on each breast as long as he or she wants. Breastfeed until your baby is finished feeding. When your baby unlatches or falls asleep while feeding from the first breast, offer the second breast. Because newborns are often sleepy in the first few weeks of life, you may need to awaken your baby to get him or her to feed. Breastfeeding times will vary from baby to baby. However,  the following rules can serve as a guide to help you ensure that your baby is properly fed:  Newborns (babies 4 weeks of age or younger) may breastfeed every 1-3 hours.  Newborns should not go longer than 3 hours during the day or 5 hours during the night without breastfeeding.  You should breastfeed your baby a minimum of 8 times in a 24-hour period until you begin to introduce solid foods to your baby at around 6 months of age.  Breast milk pumping Pumping and storing breast milk allows you to ensure that your baby is exclusively fed your breast milk, even at times when you are unable to breastfeed. This is especially important if you are going back to work while you are still breastfeeding or when you are not able to be present during feedings. Your lactation consultant can give you guidelines on how long it is safe to store breast milk. A breast pump is a machine that allows you to pump milk from your breast into a sterile bottle. The pumped breast milk can then be stored in a refrigerator or freezer. Some breast pumps are operated by hand, while others use electricity. Ask your lactation consultant which type will work best for you. Breast pumps can be purchased, but some hospitals and breastfeeding support groups lease breast pumps on a monthly basis. A lactation consultant can teach you how to hand express breast milk, if you prefer not to use a pump. Caring for your breasts while you breastfeed Nipples can become dry, cracked, and sore while breastfeeding. The following recommendations can help keep your breasts moisturized and healthy:  Avoid using soap on your nipples.  Wear a supportive bra. Although not required, special nursing bras and tank tops are designed to allow access to your breasts for breastfeeding without taking off your entire bra or top. Avoid wearing underwire-style bras or extremely tight bras.  Air dry your nipples for 3-4minutes after each feeding.  Use only cotton  bra pads to absorb leaked breast milk. Leaking of breast milk between feedings is normal.  Use lanolin on your nipples after breastfeeding. Lanolin helps to maintain your skin's normal moisture barrier. If you use pure lanolin, you do not need to wash it off before feeding your baby again. Pure lanolin is not toxic to your baby. You may also hand express a few drops of breast milk and gently massage that milk into your nipples and allow the milk to air dry.  In the first few weeks after giving birth, some women experience extremely full breasts (engorgement). Engorgement can make your   breasts feel heavy, warm, and tender to the touch. Engorgement peaks within 3-5 days after you give birth. The following recommendations can help ease engorgement:  Completely empty your breasts while breastfeeding or pumping. You may want to start by applying warm, moist heat (in the shower or with warm water-soaked hand towels) just before feeding or pumping. This increases circulation and helps the milk flow. If your baby does not completely empty your breasts while breastfeeding, pump any extra milk after he or she is finished.  Wear a snug bra (nursing or regular) or tank top for 1-2 days to signal your body to slightly decrease milk production.  Apply ice packs to your breasts, unless this is too uncomfortable for you.  Make sure that your baby is latched on and positioned properly while breastfeeding.  If engorgement persists after 48 hours of following these recommendations, contact your health care provider or a lactation consultant. Overall health care recommendations while breastfeeding  Eat healthy foods. Alternate between meals and snacks, eating 3 of each per day. Because what you eat affects your breast milk, some of the foods may make your baby more irritable than usual. Avoid eating these foods if you are sure that they are negatively affecting your baby.  Drink milk, fruit juice, and water to  satisfy your thirst (about 10 glasses a day).  Rest often, relax, and continue to take your prenatal vitamins to prevent fatigue, stress, and anemia.  Continue breast self-awareness checks.  Avoid chewing and smoking tobacco. Chemicals from cigarettes that pass into breast milk and exposure to secondhand smoke may harm your baby.  Avoid alcohol and drug use, including marijuana. Some medicines that may be harmful to your baby can pass through breast milk. It is important to ask your health care provider before taking any medicine, including all over-the-counter and prescription medicine as well as vitamin and herbal supplements. It is possible to become pregnant while breastfeeding. If birth control is desired, ask your health care provider about options that will be safe for your baby. Contact a health care provider if:  You feel like you want to stop breastfeeding or have become frustrated with breastfeeding.  You have painful breasts or nipples.  Your nipples are cracked or bleeding.  Your breasts are red, tender, or warm.  You have a swollen area on either breast.  You have a fever or chills.  You have nausea or vomiting.  You have drainage other than breast milk from your nipples.  Your breasts do not become full before feedings by the fifth day after you give birth.  You feel sad and depressed.  Your baby is too sleepy to eat well.  Your baby is having trouble sleeping.  Your baby is wetting less than 3 diapers in a 24-hour period.  Your baby has less than 3 stools in a 24-hour period.  Your baby's skin or the white part of his or her eyes becomes yellow.  Your baby is not gaining weight by 5 days of age. Get help right away if:  Your baby is overly tired (lethargic) and does not want to wake up and feed.  Your baby develops an unexplained fever. This information is not intended to replace advice given to you by your health care provider. Make sure you discuss  any questions you have with your health care provider. Document Released: 03/09/2005 Document Revised: 08/21/2015 Document Reviewed: 08/31/2012 Elsevier Interactive Patient Education  2017 [employer]  

## 2016-04-03 NOTE — Progress Notes (Signed)
PRENATAL VISIT NOTE  Subjective:  Melinda Higgins is a 26 y.o. [phone] at 7515w3d being seen today for ongoing prenatal care.  She is currently monitored for the following issues for this high-risk pregnancy and has Supervision of high risk pregnancy, antepartum; Nausea and vomiting of pregnancy, antepartum; Previous cesarean delivery affecting pregnancy, antepartum; Postoperative deep vein thrombosis (DVT) (HCC); False positive HIV serology; Cesarean section wound complication; History of spinal fusion; Spondylolisthesis; Asthma; and Family history of congenital anomaly of genitourinary system on her problem list.  Patient reports pelvic pressure. Denies VB, LOF, urinary complaints. Has not been able to stay well hydrated over the past few days due to not having phenergen..  Contractions: Not present. Vag. Bleeding: None.  Movement: Present. Denies leaking of fluid.   The following portions of the patient's history were reviewed and updated as appropriate: allergies, current medications, past family history, past medical history, past social history, past surgical history and problem list. Problem list updated.  Objective:   Vitals:   04/03/16 1009  BP: 128/77  Pulse: 73  Weight: 141 lb (64 kg)    Fetal Status: Fetal Heart Rate (bpm): 144 Fundal Height: 20 cm Movement: Present     General:  Alert, oriented and cooperative. Patient is in no acute distress.  Skin: Skin is warm and dry. No rash noted.   Cardiovascular: Normal heart rate noted  Respiratory: Normal respiratory effort, no problems with respiration noted  Abdomen: Soft, gravid, appropriate for gestational age. Pain/Pressure: Absent     Pelvic:  Cervical exam performed Dilation: Closed Effacement (%): 0 Station: Ballotable  Extremities: Normal range of motion.  Edema: None  Mental Status: Normal mood and affect. Normal behavior. Normal judgment and thought content.   Unable to leave urine specimen.  Assessment and Plan:    Pregnancy: [id number] at 6215w3d  1. Evaluate anatomy not seen on prior sonogram  - US MFM OB FOLLOW UP; Future  2. Nausea/vomiting in pregnancy  - promethazine (PHENERGAN) 25 MG tablet; Take 1 tablet (25 mg total) by mouth every 6 (six) hours as needed for nausea or vomiting.  Dispense: 30 tablet; Refill: 2  3. Asthma affecting pregnancy, antepartum  - Fluticasone-Salmeterol (ADVAIR DISKUS) 100-50 MCG/DOSE AEPB; Inhale 1 puff into the lungs 2 (two) times daily.  Dispense: 60 each; Refill: 3 - promethazine (PHENERGAN) 25 MG tablet; Take 1 tablet (25 mg total) by mouth every 6 (six) hours as needed for nausea or vomiting.  Dispense: 30 tablet; Refill: 2 - montelukast (SINGULAIR) 10 MG tablet; Take 1 tablet (10 mg total) by mouth at bedtime.  Dispense: 30 tablet; Refill: 6  4. Supervision of high risk pregnancy, antepartum  - US MFM OB FOLLOW UP; Future  5. Postoperative deep vein thrombosis (DVT), sequela (HCC) -  Requesting Rx sent to different pharmacy - enoxaparin (LOVENOX) 30 MG/0.3ML injection; Inject 0.3 mLs (30 mg total) into the skin daily.  Dispense: 30 Syringe; Refill: 6  6. Cesarean section wound complication - Surgical consult  Scheduled w/ Dr. Adrian BlackwaterStinson per pt request.  7. Moderate persistent asthma without complication   8. Family history of congenital anomaly of genitourinary system  - US MFM OB FOLLOW UP; Future  Preterm labor symptoms and general obstetric precautions including but not limited to vaginal bleeding, contractions, leaking of fluid and fetal movement were reviewed in detail with the patient. Please refer to After Visit Summary for other counseling recommendations.  Return in about 4 weeks (around 05/01/2016) for ROB.   Dorathy KinsmanVirginia Balinda Heacock,  CNM

## 2016-04-07 ENCOUNTER — Telehealth: Payer: Self-pay | Admitting: *Deleted

## 2016-04-07 DIAGNOSIS — T8189XS Other complications of procedures, not elsewhere classified, sequela: Principal | ICD-10-CM

## 2016-04-07 DIAGNOSIS — I82409 Acute embolism and thrombosis of unspecified deep veins of unspecified lower extremity: Secondary | ICD-10-CM

## 2016-04-07 MED ORDER — ENOXAPARIN SODIUM 30 MG/0.3ML ~~LOC~~ SOLN: 30.0000 mg | Freq: Every day | SUBCUTANEOUS | 6 refills | Status: DC

## 2016-04-07 NOTE — Telephone Encounter (Signed)
Rf given for Lovenox 30 mg daily

## 2016-04-10 ENCOUNTER — Encounter: Payer: Self-pay | Admitting: Family

## 2016-04-10 ENCOUNTER — Ambulatory Visit (INDEPENDENT_AMBULATORY_CARE_PROVIDER_SITE_OTHER): Payer: Medicaid Other | Admitting: Family

## 2016-04-10 VITALS — BP 110/69 | HR 87 | Temp 97.0°F | Wt 141.0 lb

## 2016-04-10 DIAGNOSIS — R42 Dizziness and giddiness: Secondary | ICD-10-CM | POA: Diagnosis not present

## 2016-04-10 DIAGNOSIS — O0990 Supervision of high risk pregnancy, unspecified, unspecified trimester: Secondary | ICD-10-CM

## 2016-04-10 DIAGNOSIS — O0992 Supervision of high risk pregnancy, unspecified, second trimester: Secondary | ICD-10-CM

## 2016-04-10 LAB — CBC
HCT: 32 % — ABNORMAL LOW (ref 35.0–45.0)
Hemoglobin: 10.8 g/dL — ABNORMAL LOW (ref 11.7–15.5)
MCH: 28.3 pg (ref 27.0–33.0)
MCHC: 33.8 g/dL (ref 32.0–36.0)
MCV: 84 fL (ref 80.0–100.0)
MPV: 10.7 fL (ref 7.5–12.5)
PLATELETS: 204 10*3/uL (ref 140–400)
RBC: 3.81 MIL/uL (ref 3.80–5.10)
RDW: 14.5 % (ref 11.0–15.0)
WBC: 10.4 10*3/uL (ref 3.8–10.8)

## 2016-04-10 LAB — COMPREHENSIVE METABOLIC PANEL
ALK PHOS: 49 U/L (ref 33–115)
ALT: 9 U/L (ref 6–29)
AST: 11 U/L (ref 10–30)
Albumin: 3.5 g/dL — ABNORMAL LOW (ref 3.6–5.1)
BILIRUBIN TOTAL: 0.9 mg/dL (ref 0.2–1.2)
BUN: 10 mg/dL (ref 7–25)
CO2: 21 mmol/L (ref 20–31)
Calcium: 8.6 mg/dL (ref 8.6–10.2)
Chloride: 104 mmol/L (ref 98–110)
Creat: 0.49 mg/dL — ABNORMAL LOW (ref 0.50–1.10)
Glucose, Bld: 79 mg/dL (ref 65–99)
POTASSIUM: 4.3 mmol/L (ref 3.5–5.3)
Sodium: 136 mmol/L (ref 135–146)
Total Protein: 6.5 g/dL (ref 6.1–8.1)

## 2016-04-10 LAB — GLUCOSE, POCT (MANUAL RESULT ENTRY): POC Glucose: 83 mg/dl (ref 70–99)

## 2016-04-10 NOTE — Progress Notes (Signed)
   PRENATAL VISIT NOTE  Subjective:  Melinda Higgins is a 26 y.o. [phone] at 1831w3d being seen today for ongoing prenatal care.  She is currently monitored for the following issues for this low-risk pregnancy and has Supervision of high risk pregnancy, antepartum; Nausea and vomiting of pregnancy, antepartum; Previous cesarean delivery affecting pregnancy, antepartum; Postoperative deep vein thrombosis (DVT) (HCC); False positive HIV serology; Cesarean section wound complication; History of spinal fusion; Spondylolisthesis; Asthma; Family history of congenital anomaly of genitourinary system; and Scoliosis on her problem list.  Patient reports dizziness x past week.  Denies chest pain, palpitations, or shortness of breath.  No loss of consciousness.  Contractions: Not present. Vag. Bleeding: None.  Movement: Present. Denies leaking of fluid.   The following portions of the patient's history were reviewed and updated as appropriate: allergies, current medications, past family history, past medical history, past social history, past surgical history and problem list. Problem list updated.  Objective:   Vitals:   04/10/16 1005  BP: 110/69  Pulse: 87  Temp: 97 F (36.1 C)  Weight: 64 kg (141 lb)    Fetal Status: Fetal Heart Rate (bpm): 141 Fundal Height: 22 cm Movement: Present     General:  Alert, oriented and cooperative. Patient is in no acute distress.  Skin: Skin is warm and dry. No rash noted.   Cardiovascular: Normal heart rate noted; no abnormal sounds heard  Respiratory: Normal respiratory effort, no problems with respiration noted  Abdomen: Soft, gravid, appropriate for gestational age. Pain/Pressure: Absent     Pelvic:  Cervical exam deferred        Extremities: Normal range of motion.  Edema: None  Mental Status: Normal mood and affect. Normal behavior. Normal judgment and thought content.   Assessment and Plan:  Pregnancy: [id number] at 7131w3d  1. Dizziness - POCT Glucose  (CBG) - CBC - Comprehensive metabolic panel  2.  Supervision of Normal Pregnancy - Reviewed ultrasound results  Preterm labor symptoms and general obstetric precautions including but not limited to vaginal bleeding, contractions, leaking of fluid and fetal movement were reviewed in detail with the patient. Please refer to After Visit Summary for other counseling recommendations.  Return in about 4 weeks (around 05/08/2016).   Eino FarberWalidah Kennith GainN Karim, CNM

## 2016-04-10 NOTE — Progress Notes (Signed)
Dizziness for 1 week.  Gets Tachycardia and cold sweats CBG-83

## 2016-04-14 ENCOUNTER — Encounter: Payer: Self-pay | Admitting: *Deleted

## 2016-04-14 ENCOUNTER — Telehealth: Payer: Self-pay

## 2016-04-14 NOTE — Telephone Encounter (Signed)
Pt called wanting to know the results of her lab work that was done on Friday. After looking over the results with Mariel AloeLora Clark, RN I informed Carollee HerterShannon that her hemoglobin was low. Carollee HerterShannon told me that she has felt miserable the last few days and her blood pressure has been low. I spoke with Mervyn GayLora and we told her to increase her fluid intake and to begin taking an iron supplement.

## 2016-04-23 ENCOUNTER — Encounter: Payer: Medicaid Other | Admitting: Obstetrics & Gynecology

## 2016-04-23 NOTE — Progress Notes (Signed)
Pt seen last week with c/o's just not feeling good.  Labs came back WNL.  Pt still feeling "bad"

## 2016-04-24 ENCOUNTER — Ambulatory Visit (INDEPENDENT_AMBULATORY_CARE_PROVIDER_SITE_OTHER): Payer: Medicaid Other | Admitting: Family

## 2016-04-24 ENCOUNTER — Encounter (HOSPITAL_COMMUNITY): Payer: Self-pay

## 2016-04-24 VITALS — BP 114/69 | HR 78 | Temp 97.1°F | Wt 144.0 lb

## 2016-04-24 DIAGNOSIS — R42 Dizziness and giddiness: Secondary | ICD-10-CM

## 2016-04-24 DIAGNOSIS — O34219 Maternal care for unspecified type scar from previous cesarean delivery: Secondary | ICD-10-CM

## 2016-04-24 DIAGNOSIS — O0992 Supervision of high risk pregnancy, unspecified, second trimester: Secondary | ICD-10-CM

## 2016-04-24 DIAGNOSIS — O0990 Supervision of high risk pregnancy, unspecified, unspecified trimester: Secondary | ICD-10-CM

## 2016-04-24 LAB — GLUCOSE, POCT (MANUAL RESULT ENTRY): POC GLUCOSE: 131 mg/dL — AB (ref 70–99)

## 2016-04-24 MED ORDER — FERROUS FUMARATE 325 (106 FE) MG PO TABS: 1.0000 | ORAL_TABLET | Freq: Every day | ORAL | 0 refills | Status: DC

## 2016-04-24 NOTE — Progress Notes (Signed)
This encounter was created in error - please disregard.

## 2016-04-24 NOTE — Progress Notes (Signed)
Pt states she just feels bad.  Thinks it is hypoglycemia.  POCT-131

## 2016-04-25 DIAGNOSIS — R42 Dizziness and giddiness: Secondary | ICD-10-CM | POA: Insufficient documentation

## 2016-04-25 MED ORDER — FUSION PLUS PO CAPS: 1.0000 | ORAL_CAPSULE | Freq: Every day | ORAL | 6 refills | Status: DC

## 2016-04-25 NOTE — Progress Notes (Signed)
   PRENATAL VISIT NOTE  Subjective:  Melinda Higgins is a 26 y.o. [phone] at 3621w4d being seen today for ongoing prenatal care.  She is currently monitored for the following issues for this high-risk pregnancy and has Supervision of high risk pregnancy, antepartum; Nausea and vomiting of pregnancy, antepartum; Previous cesarean delivery affecting pregnancy, antepartum; Postoperative deep vein thrombosis (DVT) (HCC); False positive HIV serology; Cesarean section wound complication; History of spinal fusion; Spondylolisthesis; Asthma; Family history of congenital anomaly of genitourinary system; Scoliosis; and Dizziness on her problem list.  Patient reports fatigue.  States fatigue is more than usual.  Declined taking additional iron when called by nursing staff.  Less energy than typical with other pregnancies.  Able to tolerate both food and drink. No additional symtoms. Contractions: Not present. Vag. Bleeding: None.  Movement: Present. Denies leaking of fluid.   The following portions of the patient's history were reviewed and updated as appropriate: allergies, current medications, past family history, past medical history, past social history, past surgical history and problem list. Problem list updated.  Objective:   Vitals:   04/24/16 1052  BP: 114/69  Pulse: 78  Temp: 97.1 F (36.2 C)  Weight: 144 lb (65.3 kg)    Fetal Status: Fetal Heart Rate (bpm): 149 Fundal Height: 24 cm Movement: Present     General:  Alert, oriented and cooperative. Patient is in no acute distress.  Skin: Skin is warm and dry. No rash noted.  Pale facies.  Cardiovascular: Normal heart rate noted. Normal heart rate and rhythm  Respiratory: Normal respiratory effort, no problems with respiration noted  Abdomen: Soft, gravid, appropriate for gestational age. Pain/Pressure: Present     Pelvic:  Cervical exam deferred        Extremities: Normal range of motion.  Edema: None  Mental Status: Normal mood and affect.  Normal behavior. Normal judgment and thought content.   Assessment and Plan:  Pregnancy: [id number] at 7221w4d  1. Dizziness - POCT Glucose (CBG) > 131 - Iron-FA-B Cmp-C-Biot-Probiotic (FUSION PLUS) CAPS; Take 1 capsule by mouth daily.  Dispense: 30 capsule; Refill: 6  2. Supervision of high risk pregnancy, antepartum - Discussed GDM testing at next visit; pt plans to decline, but is open to FBS and HgA1C.   3. Previous cesarean delivery affecting pregnancy, antepartum - Will have repeat  Preterm labor symptoms and general obstetric precautions including but not limited to vaginal bleeding, contractions, leaking of fluid and fetal movement were reviewed in detail with the patient. Please refer to After Visit Summary for other counseling recommendations.  Return in about 4 weeks (around 05/22/2016).   Eino FarberWalidah Kennith GainN Karim, CNM

## 2016-04-27 ENCOUNTER — Encounter (HOSPITAL_COMMUNITY): Payer: Self-pay

## 2016-04-27 ENCOUNTER — Ambulatory Visit (HOSPITAL_COMMUNITY)
Admission: RE | Admit: 2016-04-27 | Discharge: 2016-04-27 | Disposition: A | Discharge status: Home / Self Care | Payer: Medicaid Other | Source: Ambulatory Visit | Attending: Advanced Practice Midwife | Admitting: Advanced Practice Midwife

## 2016-04-27 VITALS — BP 120/67 | HR 80 | Wt 146.0 lb

## 2016-04-27 DIAGNOSIS — Z362 Encounter for other antenatal screening follow-up: Secondary | ICD-10-CM | POA: Diagnosis not present

## 2016-04-27 DIAGNOSIS — Z8279 Family history of other congenital malformations, deformations and chromosomal abnormalities: Secondary | ICD-10-CM | POA: Diagnosis not present

## 2016-04-27 DIAGNOSIS — Z3A23 23 weeks gestation of pregnancy: Secondary | ICD-10-CM | POA: Insufficient documentation

## 2016-04-27 DIAGNOSIS — Z0489 Encounter for examination and observation for other specified reasons: Secondary | ICD-10-CM

## 2016-04-27 DIAGNOSIS — O34211 Maternal care for low transverse scar from previous cesarean delivery: Secondary | ICD-10-CM | POA: Insufficient documentation

## 2016-04-27 DIAGNOSIS — O358XX0 Maternal care for other (suspected) fetal abnormality and damage, not applicable or unspecified: Secondary | ICD-10-CM

## 2016-04-27 DIAGNOSIS — IMO0002 Reserved for concepts with insufficient information to code with codable children: Secondary | ICD-10-CM

## 2016-04-27 DIAGNOSIS — O2223 Superficial thrombophlebitis in pregnancy, third trimester: Secondary | ICD-10-CM | POA: Insufficient documentation

## 2016-04-27 DIAGNOSIS — O35EXX0 Maternal care for other (suspected) fetal abnormality and damage, fetal genitourinary anomalies, not applicable or unspecified: Secondary | ICD-10-CM

## 2016-04-27 DIAGNOSIS — O0990 Supervision of high risk pregnancy, unspecified, unspecified trimester: Secondary | ICD-10-CM

## 2016-05-01 ENCOUNTER — Encounter: Payer: Medicaid Other | Admitting: Advanced Practice Midwife

## 2016-05-01 ENCOUNTER — Encounter: Payer: Self-pay | Admitting: Advanced Practice Midwife

## 2016-05-01 DIAGNOSIS — O35EXX0 Maternal care for other (suspected) fetal abnormality and damage, fetal genitourinary anomalies, not applicable or unspecified: Secondary | ICD-10-CM | POA: Insufficient documentation

## 2016-05-01 DIAGNOSIS — O358XX0 Maternal care for other (suspected) fetal abnormality and damage, not applicable or unspecified: Secondary | ICD-10-CM | POA: Insufficient documentation

## 2016-05-18 ENCOUNTER — Encounter: Payer: Medicaid Other | Admitting: Family Medicine

## 2016-05-22 ENCOUNTER — Encounter (HOSPITAL_COMMUNITY): Payer: Self-pay

## 2016-05-25 ENCOUNTER — Ambulatory Visit (HOSPITAL_COMMUNITY)
Admission: RE | Admit: 2016-05-25 | Discharge: 2016-05-25 | Disposition: A | Discharge status: Home / Self Care | Payer: Medicaid Other | Source: Ambulatory Visit | Attending: Advanced Practice Midwife | Admitting: Advanced Practice Midwife

## 2016-05-25 ENCOUNTER — Encounter (HOSPITAL_COMMUNITY): Payer: Self-pay

## 2016-05-25 DIAGNOSIS — Z8279 Family history of other congenital malformations, deformations and chromosomal abnormalities: Secondary | ICD-10-CM | POA: Diagnosis not present

## 2016-05-25 DIAGNOSIS — O0990 Supervision of high risk pregnancy, unspecified, unspecified trimester: Secondary | ICD-10-CM

## 2016-05-25 DIAGNOSIS — Z3A27 27 weeks gestation of pregnancy: Secondary | ICD-10-CM | POA: Diagnosis not present

## 2016-05-25 DIAGNOSIS — O35EXX0 Maternal care for other (suspected) fetal abnormality and damage, fetal genitourinary anomalies, not applicable or unspecified: Secondary | ICD-10-CM

## 2016-05-25 DIAGNOSIS — Z362 Encounter for other antenatal screening follow-up: Secondary | ICD-10-CM | POA: Diagnosis not present

## 2016-05-25 DIAGNOSIS — O350XX0 Maternal care for (suspected) central nervous system malformation in fetus, not applicable or unspecified: Secondary | ICD-10-CM

## 2016-05-25 DIAGNOSIS — O2232 Deep phlebothrombosis in pregnancy, second trimester: Secondary | ICD-10-CM | POA: Insufficient documentation

## 2016-05-25 DIAGNOSIS — O34211 Maternal care for low transverse scar from previous cesarean delivery: Secondary | ICD-10-CM | POA: Insufficient documentation

## 2016-05-25 DIAGNOSIS — O358XX0 Maternal care for other (suspected) fetal abnormality and damage, not applicable or unspecified: Secondary | ICD-10-CM | POA: Insufficient documentation

## 2016-05-25 DIAGNOSIS — O3509X0 Maternal care for (suspected) other central nervous system malformation or damage in fetus, not applicable or unspecified: Secondary | ICD-10-CM

## 2016-06-05 ENCOUNTER — Encounter: Payer: Self-pay | Admitting: Family Medicine

## 2016-06-05 ENCOUNTER — Ambulatory Visit (INDEPENDENT_AMBULATORY_CARE_PROVIDER_SITE_OTHER): Payer: Medicaid Other | Admitting: Family Medicine

## 2016-06-05 VITALS — BP 110/67 | HR 70 | Wt 152.0 lb

## 2016-06-05 DIAGNOSIS — J4540 Moderate persistent asthma, uncomplicated: Secondary | ICD-10-CM

## 2016-06-05 DIAGNOSIS — O34219 Maternal care for unspecified type scar from previous cesarean delivery: Secondary | ICD-10-CM

## 2016-06-05 DIAGNOSIS — O99513 Diseases of the respiratory system complicating pregnancy, third trimester: Secondary | ICD-10-CM

## 2016-06-05 DIAGNOSIS — T8189XS Other complications of procedures, not elsewhere classified, sequela: Secondary | ICD-10-CM

## 2016-06-05 DIAGNOSIS — I82409 Acute embolism and thrombosis of unspecified deep veins of unspecified lower extremity: Secondary | ICD-10-CM

## 2016-06-05 DIAGNOSIS — O0990 Supervision of high risk pregnancy, unspecified, unspecified trimester: Secondary | ICD-10-CM

## 2016-06-05 DIAGNOSIS — O909 Complication of the puerperium, unspecified: Secondary | ICD-10-CM

## 2016-06-05 NOTE — Progress Notes (Signed)
    PRENATAL VISIT NOTE  Subjective:  Melinda Higgins is a 26 y.o. [phone] at 6126w3d being seen today for ongoing prenatal care.  She is currently monitored for the following issues for this high-risk pregnancy and has Supervision of high risk pregnancy, antepartum; Nausea and vomiting of pregnancy, antepartum; Previous cesarean delivery affecting pregnancy, antepartum; Postoperative deep vein thrombosis (DVT) (HCC); False positive HIV serology; Cesarean section wound complication; History of spinal fusion; Spondylolisthesis; Asthma; Family history of congenital anomaly of genitourinary system; Scoliosis; Dizziness; Fetal renal anomaly, single gestation; and Pregnancy complicated by fetal cerebral ventriculomegaly, single gestation on her problem list.  Patient reports no complaints.  Contractions: Irritability. Vag. Bleeding: None.  Movement: Present. Denies leaking of fluid.   The following portions of the patient's history were reviewed and updated as appropriate: allergies, current medications, past family history, past medical history, past social history, past surgical history and problem list. Problem list updated.  Objective:   Vitals:   06/05/16 0840  BP: 110/67  Pulse: 70  Weight: 152 lb (68.9 kg)    Fetal Status: Fetal Heart Rate (bpm): 143   Movement: Present     General:  Alert, oriented and cooperative. Patient is in no acute distress.  Skin: Skin is warm and dry. No rash noted.   Cardiovascular: Normal heart rate noted  Respiratory: Normal respiratory effort, no problems with respiration noted  Abdomen: Soft, gravid, appropriate for gestational age. Pain/Pressure: Present     Pelvic:  Cervical exam deferred        Extremities: Normal range of motion.  Edema: None  Mental Status: Normal mood and affect. Normal behavior. Normal judgment and thought content.   Assessment and Plan:  Pregnancy: [id number] at 4226w3d  1. Supervision of high risk pregnancy, antepartum FHT and FH  normal. Pt declined glucose testing. Is low risk.  2. Cesarean section wound complication Discussed repeat cesarean section - mode of skin incision, previous wound infection, mutliple prior cesareans. With this being her third cesarean, I discussed with her the option of making a vertical skin incision, which may help prevent injury to aberrant organs due to adhesions. The patient indicated that she would prefer transverse skin incision. I offered wound vac as a prophylaxis to wound infection, which she would like. I also discussed pain control - I recommended using On-Q bupivacaine pump, which she was agreeable to.  3. Moderate persistent asthma without complication Patient states that she was prescribed Advair by a midwife, but that the insurance isn't covering it. I'm not sure whether she has just pregnancy medicaid and if that is the reason. Will investigate.  4. Postoperative deep vein thrombosis (DVT), sequela (HCC) Continue lovenox.  Preterm labor symptoms and general obstetric precautions including but not limited to vaginal bleeding, contractions, leaking of fluid and fetal movement were reviewed in detail with the patient. Please refer to After Visit Summary for other counseling recommendations.  No Follow-up on file.  More than 25 minutes spent with patient, discussing cesarean section and treatment surrounding that surgery.  Levie HeritageJacob J Alexandria Current, DO

## 2016-06-08 ENCOUNTER — Encounter (HOSPITAL_COMMUNITY): Payer: Self-pay

## 2016-06-08 ENCOUNTER — Other Ambulatory Visit: Payer: Self-pay | Admitting: Family Medicine

## 2016-06-08 MED ORDER — BUPIVACAINE 0.5 % ON-Q PUMP SINGLE CATH 300 ML: 300.0000 mL | INJECTION | Status: DC

## 2016-06-12 ENCOUNTER — Encounter: Payer: Self-pay | Admitting: Advanced Practice Midwife

## 2016-06-12 ENCOUNTER — Ambulatory Visit (INDEPENDENT_AMBULATORY_CARE_PROVIDER_SITE_OTHER): Payer: Medicaid Other | Admitting: Advanced Practice Midwife

## 2016-06-12 VITALS — BP 124/77 | HR 78 | Wt 151.0 lb

## 2016-06-12 DIAGNOSIS — F430 Acute stress reaction: Secondary | ICD-10-CM

## 2016-06-12 DIAGNOSIS — I82409 Acute embolism and thrombosis of unspecified deep veins of unspecified lower extremity: Secondary | ICD-10-CM

## 2016-06-12 DIAGNOSIS — O0990 Supervision of high risk pregnancy, unspecified, unspecified trimester: Secondary | ICD-10-CM

## 2016-06-12 DIAGNOSIS — T8189XS Other complications of procedures, not elsewhere classified, sequela: Secondary | ICD-10-CM

## 2016-06-12 DIAGNOSIS — O3509X0 Maternal care for (suspected) other central nervous system malformation or damage in fetus, not applicable or unspecified: Secondary | ICD-10-CM

## 2016-06-12 DIAGNOSIS — Z8279 Family history of other congenital malformations, deformations and chromosomal abnormalities: Secondary | ICD-10-CM

## 2016-06-12 DIAGNOSIS — O358XX0 Maternal care for other (suspected) fetal abnormality and damage, not applicable or unspecified: Secondary | ICD-10-CM

## 2016-06-12 DIAGNOSIS — O34219 Maternal care for unspecified type scar from previous cesarean delivery: Secondary | ICD-10-CM

## 2016-06-12 DIAGNOSIS — F410 Panic disorder [episodic paroxysmal anxiety] without agoraphobia: Secondary | ICD-10-CM

## 2016-06-12 DIAGNOSIS — O35EXX0 Maternal care for other (suspected) fetal abnormality and damage, fetal genitourinary anomalies, not applicable or unspecified: Secondary | ICD-10-CM

## 2016-06-12 DIAGNOSIS — Z3A33 33 weeks gestation of pregnancy: Secondary | ICD-10-CM

## 2016-06-12 DIAGNOSIS — O350XX0 Maternal care for (suspected) central nervous system malformation in fetus, not applicable or unspecified: Secondary | ICD-10-CM

## 2016-06-12 DIAGNOSIS — Z981 Arthrodesis status: Secondary | ICD-10-CM

## 2016-06-12 NOTE — Patient Instructions (Addendum)
[employer] [phone]   Breastfeeding Challenges and Solutions Even though breastfeeding is natural, it can be challenging, especially in the first few weeks after childbirth. It is normal for problems to arise when starting to breastfeed your new baby, even if you have breastfed before. This document provides some solutions to the most common breastfeeding challenges. Challenges and solutions Challenge-Cracked or Sore Nipples  Cracked or sore nipples are commonly experienced by breastfeeding mothers. Cracked or sore nipples often are caused by inadequate latching (when your baby's mouth attaches to your breast to breastfeed). Soreness can also happen if your baby is not positioned properly at your breast. Although nipple cracking and soreness are common during the first week after birth, nipple pain is never normal. If you experience nipple cracking or soreness that lasts longer than 1 week or nipple pain, call your health care provider or lactation consultant. Solution  Ensure proper latching and positioning of your baby by following the steps below:  Find a comfortable place to sit or lie down, with your neck and back well supported.  Place a pillow or rolled up blanket under your baby to bring him or her to the level of your breast (if you are seated).  Make sure that your baby's abdomen is facing your abdomen.  Gently massage your breast. With your fingertips, massage from your chest wall toward your nipple in a circular motion. This encourages milk flow. You may need to continue this action during the feeding if your milk flows slowly.  Support your breast with 4 fingers underneath and your thumb above your nipple. Make sure your fingers are well away from your nipple and your baby's mouth.  Stroke your baby's lips gently with your finger or nipple.  When your baby's mouth is open wide enough, quickly bring your baby to your breast, placing your entire nipple and as much of the  colored area around your nipple (areola) as possible into your baby's mouth.  More areola should be visible above your baby's upper lip than below the lower lip.  Your baby's tongue should be between his or her lower gum and your breast.  Ensure that your baby's mouth is correctly positioned around your nipple (latched). Your baby's lips should create a seal on your breast and be turned out (everted).  It is common for your baby to suck for about 2-3 minutes in order to start the flow of breast milk. Signs that your baby has successfully latched on to your nipple include:  Quietly tugging or quietly sucking without causing you pain.  Swallowing heard between every 3-4 sucks.  Muscle movement above and in front of his or her ears with sucking. Signs that your baby has not successfully latched on to nipple include:  Sucking sounds or smacking sounds from your baby while nursing.  Nipple pain. Ensure that your breasts stay moisturized and healthy by:  Avoiding the use of soap on your nipples.  Wearing a supportive bra. Avoid wearing underwire-style bras or tight bras.  Air drying your nipples for 3-4 minutes after each feeding.  Using only cotton bra pads to absorb breast milk leakage. Leaking of breast milk between feedings is normal. Be sure to change the pads if they become soaked with milk.  Using lanolin on your nipples after nursing. Lanolin helps to maintain your skin's normal moisture barrier. If you use pure lanolin you do not need to wash it off before feeding your baby again. Pure lanolin is not toxic to your  baby. Bonita Quin may also hand express a few drops of breast milk and gently massage that milk into your nipples, allowing it to air dry. Challenge-Breast Engorgement  Breast engorgement is the overfilling of your breasts with breast milk. In the first few weeks after giving birth, you may experience breast engorgement. Breast engorgement can make your breasts throb and feel  hard, tightly stretched, warm, and tender. Engorgement peaks about the fifth day after you give birth. Having breast engorgement does not mean you have to stop breastfeeding your baby. Solution  Breastfeed when you feel the need to reduce the fullness of your breasts or when your baby shows signs of hunger. This is called "breastfeeding on demand."  Newborns (babies younger than 4 weeks) often breastfeed every 1-3 hours during the day. You may need to awaken your baby to feed if he or she is asleep at a feeding time.  Do not allow your baby to sleep longer than 5 hours during the night without a feeding.  Pump or hand express breast milk before breastfeeding to soften your breast, areola, and nipple.  Apply warm, moist heat (in the shower or with warm water-soaked hand towels) just before feeding or pumping, or massage your breast before or during breastfeeding. This increases circulation and helps your milk to flow.  Completely empty your breasts when breastfeeding or pumping. Afterward, wear a snug bra (nursing or regular) or tank top for 1-2 days to signal your body to slightly decrease milk production. Only wear snug bras or tank tops to treat engorgement. Tight bras typically should be avoided by breastfeeding mothers. Once engorgement is relieved, return to wearing regular, loose-fitting clothes.  Apply ice packs to your breasts to lessen the pain from engorgement and relieve swelling, unless the ice is uncomfortable for you.  Do not delay feedings. Try to relax when it is time to feed your baby. This helps to trigger your "let-down reflex," which releases milk from your breast.  Ensure your baby is latched on to your breast and positioned properly while breastfeeding.  Allow your baby to remain at your breast as long as he or she is latched on well and actively sucking. Your baby will let you know when he or she is done breastfeeding by pulling away from your breast or falling  asleep.  Avoid introducing bottles or pacifiers to your baby in the early weeks of breastfeeding. Wait to introduce these things until after resolving any breastfeeding challenges.  Try to pump your milk on the same schedule as when your baby would breastfeed if you are returning to work or away from home for an extended period.  Drink plenty of fluids to avoid dehydration, which can eventually put you at greater risk of breast engorgement. If you follow these suggestions, your engorgement should improve in 24-48 hours. If you are still experiencing difficulty, call your lactation consultant or health care provider. Challenge-Plugged Milk Ducts  Plugged milk ducts occur when the duct does not drain milk effectively and becomes swollen. Wearing a tight-fitting nursing bra or having difficulty with latching may cause plugged milk ducts. Not drinking enough water (8-10 c [1.9-2.4 L] per day) can contribute to plugged milk ducts. Once a duct has become plugged, hard lumps, soreness, and redness may develop in your breast. Solution  Do not delay feedings. Feed your baby frequently and try to empty your breasts of milk at each feeding. Try breastfeeding from the affected side first so there is a better chance that the  milk will drain completely from that breast. Apply warm, moist towels to your breasts for 5-10 minutes before feeding. Alternatively, a hot shower right before breastfeeding can provide the moist heat that can encourage milk flow. Gentle massage of the sore area before and during a feeding may also help. Avoid wearing tight clothing or bras that put pressure on your breasts. Wear bras that offer good support to your breasts, but avoid underwire bras. If you have a plugged milk duct and develop a fever, you need to see your health care provider. Challenge-Mastitis  Mastitis is inflammation of your breast. It usually is caused by a bacterial infection and can cause flu-like symptoms. You may  develop redness in your breast and a fever. Often when mastitis occurs, your breast becomes firm, warm, and very painful. The most common causes of mastitis are poor latching, ineffective sucking from your baby, consistent pressure on your breast (possibly from wearing a tight-fitting bra or shirt that restricts the milk flow), unusual stress or fatigue, or missed feedings. Solution  You will be given antibiotic medicine to treat the infection. It is still important to breastfeed frequently to empty your breasts. Continuing to breastfeed while you recover from mastitis will not harm your baby. Make sure your baby is positioned properly during every feeding. Apply moist heat to your breasts for a few minutes before feeding to help the milk flow and to help your breasts empty more easily. Challenge-Thrush  Ginette Pitman is a yeast infection that can form on your nipples, in your breast, or in your baby's mouth. It causes itching, soreness, burning or stabbing pain, and sometimes a rash. Solution  You will be given a medicated ointment for your nipples, and your baby will be given a liquid medicine for his or her mouth. It is important that you and your baby are treated at the same time because thrush can be passed between you and your baby. Change disposable nursing pads often. Any bras, towels, or clothing that come in contact with infected areas of your body or your baby's body need to be washed in very hot water every day. Wash your hands and your baby's hands often. All pacifiers, bottle nipples, or toys your baby puts in his or her mouth should be boiled once a day for 20 minutes. After 1 week of treatment, discard pacifiers and bottle nipples and buy new ones. All breast pump parts that touch the milk need to be boiled for 20 minutes every day. Challenge-Low Milk Supply  You may not be producing enough milk if your baby is not gaining the proper amount of weight. Breast milk production is based on a  supply-and-demand system. Your milk supply depends on how frequently and effectively your baby empties your breast. Solution  The more you breastfeed and pump, the more breast milk you will produce. It is important that your baby empties at least one of your breasts at each feeding. If this is not happening, then use a breast pump or hand express any milk that remains. This will help to drain as much milk as possible at each feeding. It will also signal your body to produce more milk. If your baby is not emptying your breasts, it may be due to latching, sucking, or positioning problems. If low milk supply continues after addressing these issues, contact your health care provider or a lactation specialist as soon as possible. Challenge-Inverted or Flat Nipples  Some women have nipples that turn inward instead of protruding outward.  Other women have nipples that are flat. Inverted or flat nipples can sometimes make it more difficult for your baby to latch onto your breast. Solution  You may be given a small device that pulls out inverted nipples. This device should be applied right before your baby is brought to your breast. You can also try using a breast pump for a short time before placing the baby at your breast. The pump can pull your nipple outwards to help your infant latch more easily. The baby's sucking motion will help the inverted nipple protrude as well. If you have flat nipples, encourage your baby to latch onto your breast and feed frequently in the early days after birth. This will give your baby practice latching on correctly while your breast is still soft. When your milk supply increases, between the second and fifth day after birth and your breasts become full, your baby will have an easier time latching. Contact a lactation consultant if you still have concerns. She or he can teach you additional techniques to address breastfeeding problems related to nipple shape and position. Where to  find more information: [employer]: www.llli.org This information is not intended to replace advice given to you by your health care provider. Make sure you discuss any questions you have with your health care provider. Document Released: 08/31/2005 Document Revised: 08/21/2015 Document Reviewed: 09/02/2012 [employer] Interactive Patient Education  2017 [employer]

## 2016-06-12 NOTE — Progress Notes (Signed)
PRENATAL VISIT NOTE  Subjective:  Melinda Higgins is a 26 y.o. [phone] at [redacted]w[redacted]d being seen today for ongoing prenatal care.  She is currently monitored for the following issues for this high-risk pregnancy and has Supervision of high risk pregnancy, antepartum; Nausea and vomiting of pregnancy, antepartum; Previous cesarean delivery affecting pregnancy, antepartum; Postoperative deep vein thrombosis (DVT) (HCC); False positive HIV serology; Cesarean section wound complication; History of spinal fusion; Spondylolisthesis; Asthma; Family history of congenital anomaly of genitourinary system; Scoliosis; Dizziness; Fetal renal anomaly, single gestation; and Pregnancy complicated by fetal cerebral ventriculomegaly, single gestation on her problem list.  Patient reports incapacitating panic attacks and anxiety since leaning that her husband is losing his job and the death of her Grandfather last week. Had panic attack in [employer] so severe that she had to have her husband pick her up. Has tried herbal and homeopathic remedies that usually help, but they are not helping now. Can't sleep. Reports severe tension throughout back. Very worries about recurrence of postpartum depression. Didn't really like counselor that she had after last baby. Doesn't want to take medication. No SI/HI. Contractions: Irritability. Vag. Bleeding: None.  Movement: Present. Denies leaking of fluid.   The following portions of the patient's history were reviewed and updated as appropriate: allergies, current medications, past family history, past medical history, past social history, past surgical history and problem list. Problem list updated.  Objective:   Vitals:   06/12/16 1017  BP: 124/77  Pulse: 78  Weight: 151 lb (68.5 kg)    Fetal Status: Fetal Heart Rate (bpm): 139 Fundal Height: 31 cm Movement: Present     General:  Alert, oriented and cooperative. Patient is tearful, but consolable.   Skin: Skin is warm and dry. No  rash noted.   Cardiovascular: Normal heart rate noted  Respiratory: Normal respiratory effort, no problems with respiration noted  Abdomen: Soft, gravid, appropriate for gestational age. Pain/Pressure: Present     Pelvic:  Cervical exam declined        Extremities: Normal range of motion.  Edema: None  Mental Status: Normal mood and affect. Normal behavior. Normal judgment and thought content.   Assessment and Plan:  Pregnancy: [id number] at [redacted]w[redacted]d  1. Previous cesarean delivery affecting pregnancy, antepartum  - [place] MFM OB FOLLOW UP; Future  2. Pregnancy complicated by fetal cerebral ventriculomegaly, single gestation  - [place] MFM OB FOLLOW UP; Future  3. Fetal renal anomaly, single gestation  - [place] MFM OB FOLLOW UP; Future  4. Family history of congenital anomaly of genitourinary system  - [place] MFM OB FOLLOW UP; Future  5. Supervision of high risk pregnancy, antepartum  - [place] MFM OB FOLLOW UP; Future  6. [redacted] weeks gestation of pregnancy  - [place] MFM OB FOLLOW UP; Future  7. Postoperative deep vein thrombosis (DVT), sequela (HCC)   8. Panic attack due to exceptional stress - Support given. Lengthy discussion w/ about stressful events leading to this exacerbation of Sx.  - Discussed good sleep hygiene, massage, meditation and relaxation techniques, red flags, what to do in [facility] emergencies.  - Offered Ambien, declined.  - Ambulatory referral to Integrated Behavioral Health  Preterm labor symptoms and general obstetric precautions including but not limited to vaginal bleeding, contractions, leaking of fluid and fetal movement were reviewed in detail with the patient. Please refer to After Visit Summary for other counseling recommendations.  Return in about 2 weeks (around 06/26/2016) for ROB.  In-basket message re-sent to Anesthesia for consult RE:  scoliosis, rods in back.   Dorathy KinsmanVirginia Kristyne Woodring, CNM

## 2016-06-15 ENCOUNTER — Ambulatory Visit (INDEPENDENT_AMBULATORY_CARE_PROVIDER_SITE_OTHER): Payer: Medicaid Other | Admitting: Clinical

## 2016-06-15 ENCOUNTER — Telehealth: Payer: Self-pay

## 2016-06-15 DIAGNOSIS — F430 Acute stress reaction: Secondary | ICD-10-CM

## 2016-06-15 DIAGNOSIS — F410 Panic disorder [episodic paroxysmal anxiety] without agoraphobia: Secondary | ICD-10-CM | POA: Diagnosis not present

## 2016-06-15 DIAGNOSIS — O99343 Other mental disorders complicating pregnancy, third trimester: Principal | ICD-10-CM

## 2016-06-15 DIAGNOSIS — F329 Major depressive disorder, single episode, unspecified: Secondary | ICD-10-CM

## 2016-06-15 DIAGNOSIS — F411 Generalized anxiety disorder: Secondary | ICD-10-CM

## 2016-06-15 NOTE — BH Specialist Note (Signed)
Integrated Behavioral Health Initial Visit  MRN: [id number] Name: Melinda Higgins Criger   Session Start time: 9:25 Session End time: 10:25 Total time: 1 hour  Type of Service: Integrated Behavioral Health- Individual/Family Interpretor:No. Interpretor Name and Language: n/a   Warm Hand Off Completed.       SUBJECTIVE: Melinda Higgins Muckle is a 26 y.o. female accompanied by patient. Patient was referred by [name], CNM for anxiety, panic attacks, psychosocial Patient reports the following symptoms/concerns: Pt states that her primary concern is having panic attacks, and feeling overwhelmed by life stressors; past coping  using essential oils, epsom salt baths, sleep app, meditation, daily walks and emotional support from mom and homeschool support groups have not been enough in past month, since news of husbands job loss. Pt does not want medication today, but will consider at postpartum, as that has helped with previous postpartum depression. 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 Husband will lose job soon. Self-Care: essential oils, epsom salt baths, meditation, daily walks, participate in two support groups, sleep app Life Changes: Current pregnancy, husband's job loss  GOALS ADDRESSED: Patient will reduce symptoms of: anxiety and depression and increase knowledge and/or ability of: coping skills and also: Increase healthy adjustment to current life circumstances   INTERVENTIONS: Motivational Interviewing, Mindfulness or Management consultantelaxation Training, Psychoeducation  and/or Health Education and Link to [facility]  Standardized Assessments completed: GAD-7 and PHQ 9  ASSESSMENT: Patient currently experiencing Generalized anxiety disorder.  Patient may benefit from psychoeducation and brief therapeutic interventions regarding coping with symptoms of anxiety and depression.  PLAN: 1. Follow up with behavioral health clinician on one month 2. Behavioral recommendations:  -Continue using essential oils, epsom salt baths, meditation, daily walks, sleep app, and participate in support groups, as long as they remain helpful -Consider Stop, Breathe, and Think app for additional self-coping strategy -Consider CALM relaxation breathing exercise daily -Read educational material regarding coping with symptoms of anxiety with panic attack and depression -Continue plans to have Certified Postpartum Doula(CPD) provide services postpartum -Consider using Postpartum Planner for additional prepartations -Consider using [employer] as food resource/pantries, as needed -Consider [facility] as additional community support 3. Referral(s): Integrated Art gallery managerBehavioral Health Services (In Clinic), Community Mental Health Services (LME/Outside Clinic) and Community Resources:  Food and [facility] 4. "From scale of 1-10, how likely are you to follow plan?": 8  Rae LipsJamie C Georgeanna Radziewicz, LCSWA  Depression screen [facility] 2/9 06/15/2016  Decreased Interest 3  Down, Depressed, Hopeless 3  PHQ - 2 Score 6  Altered sleeping 3  Tired, decreased energy 3  Change in appetite 0  Feeling bad or failure about yourself  3  Trouble concentrating 3  Moving slowly or fidgety/restless 2  Suicidal thoughts 0  PHQ-9 Score 20   GAD 7 : Generalized Anxiety Score 06/15/2016  Nervous, Anxious, on Edge 3  Control/stop worrying 3  Worry too much - different things 3  Trouble relaxing 3  Restless 3  Easily annoyed or irritable 3  Afraid - awful might happen 3  Total GAD  7 Score 21

## 2016-06-15 NOTE — Telephone Encounter (Signed)
Pt was seen for anxiety/depression during pregnancy. She went to see a counselor this morning but, didn't find it helpful. She wants to see a psychiatrist and be put on medication. I put in referral for psychiatrist and I am messaging [name], CNM to see about meds.

## 2016-06-16 ENCOUNTER — Other Ambulatory Visit: Payer: Self-pay

## 2016-06-16 DIAGNOSIS — F419 Anxiety disorder, unspecified: Secondary | ICD-10-CM

## 2016-06-16 DIAGNOSIS — O99343 Other mental disorders complicating pregnancy, third trimester: Principal | ICD-10-CM

## 2016-06-16 MED ORDER — SERTRALINE HCL 50 MG PO TABS: 50.0000 mg | ORAL_TABLET | Freq: Every day | ORAL | 2 refills | Status: DC

## 2016-06-16 NOTE — Progress Notes (Signed)
I spoke with [name], CNM and she told me [name].

## 2016-06-22 ENCOUNTER — Telehealth: Payer: Self-pay | Admitting: Clinical

## 2016-06-22 NOTE — Telephone Encounter (Addendum)
F/u call to patient regarding current emotional health. Ms. Rensch says she decided to request [facility] medication, via her medical provider, and has an appointment with psychiatry on April 26th for [facility] medication management. Pt began taking Zoloft on 06/16/16, and feels it is helping her to cope.

## 2016-07-06 ENCOUNTER — Other Ambulatory Visit: Payer: Self-pay | Admitting: Family Medicine

## 2016-07-06 ENCOUNTER — Ambulatory Visit (INDEPENDENT_AMBULATORY_CARE_PROVIDER_SITE_OTHER): Payer: Medicaid Other | Admitting: Family Medicine

## 2016-07-06 VITALS — BP 149/60 | HR 86 | Wt 153.0 lb

## 2016-07-06 DIAGNOSIS — O35EXX0 Maternal care for other (suspected) fetal abnormality and damage, fetal genitourinary anomalies, not applicable or unspecified: Secondary | ICD-10-CM

## 2016-07-06 DIAGNOSIS — O99513 Diseases of the respiratory system complicating pregnancy, third trimester: Secondary | ICD-10-CM

## 2016-07-06 DIAGNOSIS — O0993 Supervision of high risk pregnancy, unspecified, third trimester: Secondary | ICD-10-CM

## 2016-07-06 DIAGNOSIS — T8189XS Other complications of procedures, not elsewhere classified, sequela: Secondary | ICD-10-CM

## 2016-07-06 DIAGNOSIS — J45909 Unspecified asthma, uncomplicated: Secondary | ICD-10-CM

## 2016-07-06 DIAGNOSIS — O0990 Supervision of high risk pregnancy, unspecified, unspecified trimester: Secondary | ICD-10-CM

## 2016-07-06 DIAGNOSIS — O99519 Diseases of the respiratory system complicating pregnancy, unspecified trimester: Secondary | ICD-10-CM

## 2016-07-06 DIAGNOSIS — O358XX0 Maternal care for other (suspected) fetal abnormality and damage, not applicable or unspecified: Secondary | ICD-10-CM

## 2016-07-06 DIAGNOSIS — I82409 Acute embolism and thrombosis of unspecified deep veins of unspecified lower extremity: Secondary | ICD-10-CM

## 2016-07-06 DIAGNOSIS — O34219 Maternal care for unspecified type scar from previous cesarean delivery: Secondary | ICD-10-CM

## 2016-07-06 NOTE — Progress Notes (Signed)
   PRENATAL VISIT NOTE  Subjective:  Melinda Higgins is a 26 y.o. [phone] at [redacted]w[redacted]d being seen today for ongoing prenatal care.  She is currently monitored for the following issues for this high-risk pregnancy and has Supervision of high risk pregnancy, antepartum; Nausea and vomiting of pregnancy, antepartum; Previous cesarean delivery affecting pregnancy, antepartum; Postoperative deep vein thrombosis (DVT) (HCC); False positive HIV serology; Cesarean section wound complication; History of spinal fusion; Spondylolisthesis; Asthma; Family history of congenital anomaly of genitourinary system; Scoliosis; Dizziness; Fetal renal anomaly, single gestation; and Pregnancy complicated by fetal cerebral ventriculomegaly, single gestation on her problem list.  Patient reports no complaints.  Contractions: Irritability. Vag. Bleeding: None, Scant.  Movement: Present. Denies leaking of fluid.   The following portions of the patient's history were reviewed and updated as appropriate: allergies, current medications, past family history, past medical history, past social history, past surgical history and problem list. Problem list updated.  Objective:   Vitals:   07/06/16 0930  BP: (!) 149/60  Pulse: 86  Weight: 153 lb (69.4 kg)    Fetal Status:     Movement: Present     General:  Alert, oriented and cooperative. Patient is in no acute distress.  Skin: Skin is warm and dry. No rash noted.   Cardiovascular: Normal heart rate noted  Respiratory: Normal respiratory effort, no problems with respiration noted  Abdomen: Soft, gravid, appropriate for gestational age. Pain/Pressure: Present     Pelvic:  Cervical exam deferred        Extremities: Normal range of motion.  Edema: None  Mental Status: Normal mood and affect. Normal behavior. Normal judgment and thought content.   Assessment and Plan:  Pregnancy: [id number] at [redacted]w[redacted]d  1. Supervision of high risk pregnancy, antepartum FHT normal  2. Previous  cesarean delivery affecting pregnancy, antepartum Plans for repeat  3. Fetal renal anomaly, single gestation [place] tomorrow  4. Postoperative deep vein thrombosis (DVT), sequela (HCC) Continue lovenox  5. Asthma affecting pregnancy, antepartum controlled  Preterm labor symptoms and general obstetric precautions including but not limited to vaginal bleeding, contractions, leaking of fluid and fetal movement were reviewed in detail with the patient. Please refer to After Visit Summary for other counseling recommendations.  No Follow-up on file.   Levie Heritage, DO

## 2016-07-07 ENCOUNTER — Encounter (HOSPITAL_COMMUNITY): Payer: Self-pay

## 2016-07-07 ENCOUNTER — Ambulatory Visit (HOSPITAL_COMMUNITY)
Admission: RE | Admit: 2016-07-07 | Discharge: 2016-07-07 | Disposition: A | Discharge status: Home / Self Care | Payer: Medicaid Other | Source: Ambulatory Visit | Attending: Advanced Practice Midwife | Admitting: Advanced Practice Midwife

## 2016-07-07 DIAGNOSIS — Z7901 Long term (current) use of anticoagulants: Secondary | ICD-10-CM | POA: Diagnosis not present

## 2016-07-07 DIAGNOSIS — O0990 Supervision of high risk pregnancy, unspecified, unspecified trimester: Secondary | ICD-10-CM

## 2016-07-07 DIAGNOSIS — Z3A34 34 weeks gestation of pregnancy: Secondary | ICD-10-CM | POA: Diagnosis not present

## 2016-07-07 DIAGNOSIS — O34211 Maternal care for low transverse scar from previous cesarean delivery: Secondary | ICD-10-CM | POA: Insufficient documentation

## 2016-07-07 DIAGNOSIS — O34219 Maternal care for unspecified type scar from previous cesarean delivery: Secondary | ICD-10-CM

## 2016-07-07 DIAGNOSIS — O358XX0 Maternal care for other (suspected) fetal abnormality and damage, not applicable or unspecified: Secondary | ICD-10-CM | POA: Diagnosis not present

## 2016-07-07 DIAGNOSIS — O35EXX0 Maternal care for other (suspected) fetal abnormality and damage, fetal genitourinary anomalies, not applicable or unspecified: Secondary | ICD-10-CM

## 2016-07-07 DIAGNOSIS — O3509X0 Maternal care for (suspected) other central nervous system malformation or damage in fetus, not applicable or unspecified: Secondary | ICD-10-CM

## 2016-07-07 DIAGNOSIS — Z362 Encounter for other antenatal screening follow-up: Secondary | ICD-10-CM | POA: Insufficient documentation

## 2016-07-07 DIAGNOSIS — O350XX0 Maternal care for (suspected) central nervous system malformation in fetus, not applicable or unspecified: Secondary | ICD-10-CM

## 2016-07-07 DIAGNOSIS — Z86718 Personal history of other venous thrombosis and embolism: Secondary | ICD-10-CM | POA: Diagnosis not present

## 2016-07-07 DIAGNOSIS — Z3A33 33 weeks gestation of pregnancy: Secondary | ICD-10-CM

## 2016-07-07 DIAGNOSIS — Z8279 Family history of other congenital malformations, deformations and chromosomal abnormalities: Secondary | ICD-10-CM

## 2016-07-08 ENCOUNTER — Inpatient Hospital Stay (HOSPITAL_COMMUNITY)
Admission: AD | Admit: 2016-07-08 | Discharge: 2016-07-08 | Disposition: A | Discharge status: Home / Self Care | Payer: Medicaid Other | Source: Ambulatory Visit | Attending: Obstetrics & Gynecology | Admitting: Obstetrics & Gynecology

## 2016-07-08 ENCOUNTER — Encounter (HOSPITAL_COMMUNITY): Payer: Self-pay | Admitting: *Deleted

## 2016-07-08 DIAGNOSIS — O212 Late vomiting of pregnancy: Secondary | ICD-10-CM | POA: Diagnosis present

## 2016-07-08 DIAGNOSIS — J4541 Moderate persistent asthma with (acute) exacerbation: Secondary | ICD-10-CM

## 2016-07-08 DIAGNOSIS — O9989 Other specified diseases and conditions complicating pregnancy, childbirth and the puerperium: Secondary | ICD-10-CM

## 2016-07-08 DIAGNOSIS — D649 Anemia, unspecified: Secondary | ICD-10-CM | POA: Insufficient documentation

## 2016-07-08 DIAGNOSIS — Z86718 Personal history of other venous thrombosis and embolism: Secondary | ICD-10-CM | POA: Insufficient documentation

## 2016-07-08 DIAGNOSIS — O26899 Other specified pregnancy related conditions, unspecified trimester: Secondary | ICD-10-CM | POA: Diagnosis not present

## 2016-07-08 DIAGNOSIS — O99013 Anemia complicating pregnancy, third trimester: Secondary | ICD-10-CM | POA: Diagnosis not present

## 2016-07-08 DIAGNOSIS — J45909 Unspecified asthma, uncomplicated: Secondary | ICD-10-CM | POA: Insufficient documentation

## 2016-07-08 DIAGNOSIS — Z889 Allergy status to unspecified drugs, medicaments and biological substances status: Secondary | ICD-10-CM | POA: Diagnosis not present

## 2016-07-08 DIAGNOSIS — Z91010 Allergy to peanuts: Secondary | ICD-10-CM | POA: Insufficient documentation

## 2016-07-08 DIAGNOSIS — O99513 Diseases of the respiratory system complicating pregnancy, third trimester: Secondary | ICD-10-CM | POA: Diagnosis present

## 2016-07-08 DIAGNOSIS — Z3A34 34 weeks gestation of pregnancy: Secondary | ICD-10-CM | POA: Diagnosis not present

## 2016-07-08 DIAGNOSIS — Z91018 Allergy to other foods: Secondary | ICD-10-CM | POA: Diagnosis not present

## 2016-07-08 DIAGNOSIS — O09893 Supervision of other high risk pregnancies, third trimester: Secondary | ICD-10-CM | POA: Diagnosis present

## 2016-07-08 DIAGNOSIS — R0602 Shortness of breath: Secondary | ICD-10-CM | POA: Diagnosis not present

## 2016-07-08 DIAGNOSIS — Z8279 Family history of other congenital malformations, deformations and chromosomal abnormalities: Secondary | ICD-10-CM | POA: Diagnosis not present

## 2016-07-08 DIAGNOSIS — O99519 Diseases of the respiratory system complicating pregnancy, unspecified trimester: Secondary | ICD-10-CM

## 2016-07-08 LAB — CBC
HEMATOCRIT: 30.2 % — AB (ref 36.0–46.0)
Hemoglobin: 10 g/dL — ABNORMAL LOW (ref 12.0–15.0)
MCH: 26.7 pg (ref 26.0–34.0)
MCHC: 33.1 g/dL (ref 30.0–36.0)
MCV: 80.5 fL (ref 78.0–100.0)
Platelets: 134 10*3/uL — ABNORMAL LOW (ref 150–400)
RBC: 3.75 MIL/uL — ABNORMAL LOW (ref 3.87–5.11)
RDW: 16.2 % — AB (ref 11.5–15.5)
WBC: 11.4 10*3/uL — ABNORMAL HIGH (ref 4.0–10.5)

## 2016-07-08 LAB — URINALYSIS, ROUTINE W REFLEX MICROSCOPIC
BILIRUBIN URINE: NEGATIVE
GLUCOSE, UA: 150 mg/dL — AB
HGB URINE DIPSTICK: NEGATIVE
Ketones, ur: NEGATIVE mg/dL
NITRITE: NEGATIVE
Protein, ur: NEGATIVE mg/dL
SPECIFIC GRAVITY, URINE: 1.011 (ref 1.005–1.030)
pH: 7 (ref 5.0–8.0)

## 2016-07-08 MED ORDER — FLUTICASONE-SALMETEROL 250-50 MCG/DOSE IN AEPB: 1.0000 | INHALATION_SPRAY | Freq: Two times a day (BID) | RESPIRATORY_TRACT | 2 refills | Status: DC

## 2016-07-08 MED ORDER — PREDNISONE 20 MG PO TABS: 60.0000 mg | ORAL_TABLET | Freq: Every day | ORAL | 0 refills | Status: DC

## 2016-07-08 NOTE — MAU Note (Addendum)
Pt has been vomiting the last 2 days, asthma started acting up last night.  Woke up very disoriented & SOB this morning, sweating a lot.  Has taken asthma meds last night & this morning.  Vomiting continues, denies diarrhea or fever.  Has had intermittent spotting since Friday.  No contractions or LOF.  Talking without difficulty in triage.

## 2016-07-08 NOTE — Discharge Instructions (Signed)

## 2016-07-08 NOTE — MAU Provider Note (Signed)
History     CSN: [id number]  Arrival date and time: 07/08/16 1300   First Provider Initiated Contact with Patient 07/08/16 1344      Chief Complaint  Patient presents with  . Asthma  . Emesis During Pregnancy   HPI   Melinda Higgins is a 26 y.o. female [phone] @ [redacted]w[redacted]d with a history of anemia and asthma, here in the MAU with shortness of breath and chest tightness. Symptoms started last night. She thought it was her asthma symptoms so she used her albuterol, however this did not make her symptoms go away. It made her feel disoriented and dizzy so she did not use it again. She woke up this morning feeling bad. Like she was not herself. She has had poor asthma control in her last pregnancies, requiring steroid treatment in the 3rd trimester. Since her arrival to MAU she is feeling much better. The shortness of breath and chest tightness have subsided. She denies symptoms at this time. She is concerned as to why she had those feelings. She is using Advair daily, however feels that her dose is less than what she has used in the past.  + fetal movements, denies vaginal bleeding or leaking of water.    OB History    Gravida Para Term Preterm AB Living   SAB TAB Ectopic Multiple Live Births   1       3      Past Medical History:  Diagnosis Date  . Anemia   . Asthma   . Family history of congenital anomaly of genitourinary system 04/03/2016   Previous child w/ pyelectasis requiring surgery. Husband w/ Hx Pyelectasis. MFM recommends F/U US at 28 and 36 weeks to eval for pyelectasis.  Marland Kitchen Postoperative deep vein thrombosis (DVT) (HCC) 2005   after spinal surgery    Past Surgical History:  Procedure Laterality Date  . BACK SURGERY     rods put in back  . CESAREAN SECTION     3 times  . EYE SURGERY      Family History  Problem Relation Age of Onset  . Diabetes    . Heart disease    . Diabetes Mother   . Heart disease Mother     Social History  Substance Use  Topics  . Smoking status: Never Smoker  . Smokeless tobacco: Never Used  . Alcohol use No    Allergies:  Allergies  Allergen Reactions  . Allergy Relief  [Chlorpheniramine Maleate] Swelling    Patient states generalized swelling when taking benadryl.  Reported on 08/29/2013 1133  . Cabbage Swelling    Throat swelling  . Lac Bovis Swelling  . Soy Allergy Swelling  . Benadryl [Diphenhydramine Hcl] Other (See Comments)    Makes pt numb all over  . Peanut-Containing Drug Products Swelling    Prescriptions Prior to Admission  Medication Sig Dispense Refill Last Dose  . albuterol (PROVENTIL HFA;VENTOLIN HFA) 108 (90 Base) MCG/ACT inhaler Inhale 2 puffs into the lungs every 6 (six) hours as needed for wheezing or shortness of breath.   07/08/2016 at Unknown time  . Doxylamine-Pyridoxine (DICLEGIS) 10-10 MG TBEC Take 1 tablet by mouth 3 (three) times daily. Pt takes one tablet in AM, one in the afternoon , and 2 tablets a bedtime.   Past Month at Unknown time  . enoxaparin (LOVENOX) 30 MG/0.3ML injection Inject 0.3 mLs (30 mg total) into the skin daily. 30 Syringe 6 07/07/2016  at Unknown time  . Fluticasone-Salmeterol (ADVAIR DISKUS) 100-50 MCG/DOSE AEPB Inhale 1 puff into the lungs 2 (two) times daily. 60 each 3 07/08/2016 at Unknown time  . montelukast (SINGULAIR) 10 MG tablet Take 1 tablet (10 mg total) by mouth at bedtime. 30 tablet 6 07/08/2016 at Unknown time  . OVER THE COUNTER MEDICATION Take 1 tablet by mouth daily. Patient takes blood builder     . Prenatal Vit-Fe Fumarate-FA (PRENATAL MULTIVITAMIN) TABS tablet Take 1 tablet by mouth daily at 12 noon.   07/07/2016 at Unknown time  . promethazine (PHENERGAN) 25 MG tablet Take 1 tablet (25 mg total) by mouth every 6 (six) hours as needed for nausea or vomiting. 30 tablet 2 07/07/2016 at Unknown time  . sertraline (ZOLOFT) 50 MG tablet Take 1 tablet (50 mg total) by mouth daily. 30 tablet 2 07/08/2016 at Unknown time  . Iron-FA-B  Cmp-C-Biot-Probiotic (FUSION PLUS) CAPS Take 1 capsule by mouth daily. (Patient not taking: Reported on 07/08/2016) 30 capsule 6 Not Taking at Unknown time   Results for orders placed or performed during the hospital encounter of 07/08/16 (from the past 48 hour(s))  Urinalysis, Routine w reflex microscopic     Status: Abnormal   Collection Time: 07/08/16  1:18 PM  Result Value Ref Range   Color, Urine YELLOW YELLOW   APPearance CLOUDY (A) CLEAR   Specific Gravity, Urine 1.011 1.005 - 1.030   pH 7.0 5.0 - 8.0   Glucose, UA 150 (A) NEGATIVE mg/dL   Hgb urine dipstick NEGATIVE NEGATIVE   Bilirubin Urine NEGATIVE NEGATIVE   Ketones, ur NEGATIVE NEGATIVE mg/dL   Protein, ur NEGATIVE NEGATIVE mg/dL   Nitrite NEGATIVE NEGATIVE   Leukocytes, UA SMALL (A) NEGATIVE   RBC / HPF 0-5 0 - 5 RBC/hpf   WBC, UA 6-30 0 - 5 WBC/hpf   Bacteria, UA RARE (A) NONE SEEN   Squamous Epithelial / LPF TOO NUMEROUS TO COUNT (A) NONE SEEN   Mucous PRESENT   CBC     Status: Abnormal   Collection Time: 07/08/16  1:55 PM  Result Value Ref Range   WBC 11.4 (H) 4.0 - 10.5 K/uL   RBC 3.75 (L) 3.87 - 5.11 MIL/uL   Hemoglobin 10.0 (L) 12.0 - 15.0 g/dL   HCT 16.1 (L) 09.6 - 04.5 %   MCV 80.5 78.0 - 100.0 fL   MCH 26.7 26.0 - 34.0 pg   MCHC 33.1 30.0 - 36.0 g/dL   RDW 40.9 (H) 81.1 - 91.4 %   Platelets 134 (L) 150 - 400 K/uL    Review of Systems  Constitutional: Positive for fatigue.  Respiratory: Positive for shortness of breath. Negative for apnea, cough, wheezing and stridor.   Neurological: Positive for dizziness. Negative for syncope.   Physical Exam   Blood pressure 114/70, pulse 78, temperature 97.7 F (36.5 C), temperature source Oral, resp. rate 18, last menstrual period 11/12/2015, SpO2 99 %, unknown if currently breastfeeding.  Physical Exam  Constitutional: She is oriented to person, place, and time. She appears well-developed and well-nourished.  Non-toxic appearance. She does not have a sickly  appearance. She does not appear ill. No distress.  HENT:  Head: Normocephalic.  Eyes: Pupils are equal, round, and reactive to light.  Cardiovascular: Normal rate.   Respiratory: Breath sounds normal. No respiratory distress. She has no wheezes. She has no rales. She exhibits no tenderness.  Musculoskeletal: Normal range of motion.  Neurological: She is alert and oriented to person, place,  and time.  Skin: Skin is warm. She is not diaphoretic. There is pallor.  Psychiatric: Her behavior is normal.   Fetal Tracing: Baseline: 135 bpm Variability: Moderate  Accelerations: 15x15 Decelerations: None Toco: none   MAU Course  Procedures  None  MDM  Pulse ox 99-100 % on RA Patient without wheezes  CBC showing Hgb 10 which is higher than her normal.  Assessment and Plan   A:  1. Shortness of breath due to pregnancy   2. Moderate persistent asthma with acute exacerbation   3. Asthma affecting pregnancy, antepartum     P:  Discharge home in stable condition Change Advair to 250/50 mcg Continue Singular Start Rx Prednisone  Follow up with OB this week Return to MAU as needed, if symptoms worsen  Duane Lope, NP 07/08/2016 5:03 PM

## 2016-07-14 ENCOUNTER — Other Ambulatory Visit: Payer: Self-pay

## 2016-07-14 MED ORDER — CEPHALEXIN 500 MG PO CAPS: 500.0000 mg | ORAL_CAPSULE | Freq: Four times a day (QID) | ORAL | 0 refills | Status: DC

## 2016-07-14 NOTE — Progress Notes (Signed)
Order from Dr. Adrian Blackwater to answer patient via email. Armandina Stammer RNBSN

## 2016-07-15 ENCOUNTER — Encounter (HOSPITAL_COMMUNITY): Payer: Self-pay

## 2016-07-15 ENCOUNTER — Inpatient Hospital Stay (HOSPITAL_COMMUNITY): Payer: Medicaid Other

## 2016-07-15 ENCOUNTER — Inpatient Hospital Stay (HOSPITAL_COMMUNITY)
Admission: AD | Admit: 2016-07-15 | Discharge: 2016-07-15 | Disposition: A | Discharge status: Home / Self Care | Payer: Medicaid Other | Source: Ambulatory Visit | Attending: Obstetrics & Gynecology | Admitting: Obstetrics & Gynecology

## 2016-07-15 DIAGNOSIS — Z3A35 35 weeks gestation of pregnancy: Secondary | ICD-10-CM | POA: Insufficient documentation

## 2016-07-15 DIAGNOSIS — O99513 Diseases of the respiratory system complicating pregnancy, third trimester: Secondary | ICD-10-CM | POA: Insufficient documentation

## 2016-07-15 DIAGNOSIS — Z86718 Personal history of other venous thrombosis and embolism: Secondary | ICD-10-CM | POA: Diagnosis not present

## 2016-07-15 DIAGNOSIS — O358XX0 Maternal care for other (suspected) fetal abnormality and damage, not applicable or unspecified: Secondary | ICD-10-CM

## 2016-07-15 DIAGNOSIS — O26893 Other specified pregnancy related conditions, third trimester: Secondary | ICD-10-CM | POA: Diagnosis not present

## 2016-07-15 DIAGNOSIS — O9989 Other specified diseases and conditions complicating pregnancy, childbirth and the puerperium: Secondary | ICD-10-CM

## 2016-07-15 DIAGNOSIS — R51 Headache: Secondary | ICD-10-CM | POA: Diagnosis not present

## 2016-07-15 DIAGNOSIS — O0990 Supervision of high risk pregnancy, unspecified, unspecified trimester: Secondary | ICD-10-CM

## 2016-07-15 DIAGNOSIS — O350XX0 Maternal care for (suspected) central nervous system malformation in fetus, not applicable or unspecified: Secondary | ICD-10-CM

## 2016-07-15 DIAGNOSIS — O35EXX0 Maternal care for other (suspected) fetal abnormality and damage, fetal genitourinary anomalies, not applicable or unspecified: Secondary | ICD-10-CM

## 2016-07-15 DIAGNOSIS — O3509X0 Maternal care for (suspected) other central nervous system malformation or damage in fetus, not applicable or unspecified: Secondary | ICD-10-CM

## 2016-07-15 LAB — URINALYSIS, ROUTINE W REFLEX MICROSCOPIC
BILIRUBIN URINE: NEGATIVE
Glucose, UA: >=500 mg/dL — AB
Hgb urine dipstick: NEGATIVE
KETONES UR: 5 mg/dL — AB
Nitrite: NEGATIVE
Protein, ur: NEGATIVE mg/dL
SPECIFIC GRAVITY, URINE: 1.014 (ref 1.005–1.030)
pH: 6 (ref 5.0–8.0)

## 2016-07-15 MED ORDER — CYCLOBENZAPRINE HCL 5 MG PO TABS: 5.0000 mg | ORAL_TABLET | Freq: Three times a day (TID) | ORAL | 0 refills | Status: DC | PRN

## 2016-07-15 MED ORDER — BUTALBITAL-APAP-CAFFEINE 50-325-40 MG PO CAPS: 1.0000 | ORAL_CAPSULE | Freq: Four times a day (QID) | ORAL | 0 refills | Status: DC | PRN

## 2016-07-15 NOTE — MAU Note (Addendum)
Pt c/o "pain in back of head" x3 weeks. States it has progessively gotten worse over the past 3 days. States it started out intermittently but is now constant. Does not feel like "headache". States it is sharp. Has not taken anything for it. Some blurry vision today.

## 2016-07-15 NOTE — MAU Provider Note (Signed)
History     CSN: [id number]  Arrival date and time: 07/15/16 9562   First Provider Initiated Contact with Patient 07/15/16 2010      Chief Complaint  Patient presents with  . Headache   G5P3013 .1 wks here with head pain. Pain started about 3 weeks ago. She describes as sharp, intermittent, only lasts a few seconds, and in the occipital region. Chewing food worsens the pain and various other activities such as going up and down stairs. Pain has worsened over the last 3 days and occurs more frequently. This pain is not similar to a HA. She endorses blurry vision today. No fevers or recent illness. No pregnancy concerns. Reports good FM. No VB, LOF, or ctx. Pregnancy is complicated by hx of scoliosis, spinal fusion, spondylolithesis, DVT on Lovenox, and previous CS x3.    OB History    Gravida Para Term Preterm AB Living   SAB TAB Ectopic Multiple Live Births   1       3      Past Medical History:  Diagnosis Date  . Anemia   . Asthma   . Family history of congenital anomaly of genitourinary system 04/03/2016   Previous child w/ pyelectasis requiring surgery. Husband w/ Hx Pyelectasis. MFM recommends F/U US at 28 and 36 weeks to eval for pyelectasis.  Marland Kitchen Postoperative deep vein thrombosis (DVT) (HCC) 2005   after spinal surgery    Past Surgical History:  Procedure Laterality Date  . BACK SURGERY     rods put in back  . CESAREAN SECTION     3 times  . EYE SURGERY      Family History  Problem Relation Age of Onset  . Diabetes    . Heart disease    . Diabetes Mother   . Heart disease Mother     Social History  Substance Use Topics  . Smoking status: Never Smoker  . Smokeless tobacco: Never Used  . Alcohol use No    Allergies:  Allergies  Allergen Reactions  . Allergy Relief  [Chlorpheniramine Maleate] Swelling    Patient states generalized swelling when taking benadryl.  Reported on 08/29/2013 1133  . Cabbage Swelling    Throat swelling  . Soy  Allergy Swelling  . Benadryl [Diphenhydramine Hcl] Other (See Comments)    Makes pt numb all over  . Lactose Intolerance (Gi) Swelling  . Peanut-Containing Drug Products Swelling    Prescriptions Prior to Admission  Medication Sig Dispense Refill Last Dose  . albuterol (PROVENTIL HFA;VENTOLIN HFA) 108 (90 Base) MCG/ACT inhaler Inhale 2 puffs into the lungs every 6 (six) hours as needed for wheezing or shortness of breath.   Past Week at Unknown time  . enoxaparin (LOVENOX) 30 MG/0.3ML injection Inject 0.3 mLs (30 mg total) into the skin daily. 30 Syringe 6 07/14/2016 at 2100  . Fluticasone-Salmeterol (ADVAIR DISKUS) 250-50 MCG/DOSE AEPB Inhale 1 puff into the lungs 2 (two) times daily. 60 each 2 07/15/2016 at Unknown time  . montelukast (SINGULAIR) 10 MG tablet Take 1 tablet (10 mg total) by mouth at bedtime. 30 tablet 6 07/14/2016 at Unknown time  . OVER THE COUNTER MEDICATION Take 1 tablet by mouth daily. Patient takes blood builder   07/15/2016 at Unknown time  . Prenatal Vit-Fe Fumarate-FA (PRENATAL MULTIVITAMIN) TABS tablet Take 1 tablet by mouth daily at 12 noon.   07/15/2016 at Unknown time  . promethazine (PHENERGAN) 25 MG tablet Take  1 tablet (25 mg total) by mouth every 6 (six) hours as needed for nausea or vomiting. 30 tablet 2 Past Week at Unknown time  . sertraline (ZOLOFT) 50 MG tablet Take 1 tablet (50 mg total) by mouth daily. 30 tablet 2 07/15/2016 at Unknown time  . cephALEXin (KEFLEX) 500 MG capsule Take 1 capsule (500 mg total) by mouth 4 (four) times daily. (Patient not taking: Reported on 07/15/2016) 28 capsule 0 Not Taking at Unknown time  . Iron-FA-B Cmp-C-Biot-Probiotic (FUSION PLUS) CAPS Take 1 capsule by mouth daily. (Patient not taking: Reported on 07/08/2016) 30 capsule 6 Not Taking at Unknown time  . predniSONE (DELTASONE) 20 MG tablet Take 3 tablets (60 mg total) by mouth daily with breakfast. (Patient not taking: Reported on 07/15/2016) 15 tablet 0 Completed Course at  Unknown time    Review of Systems  Constitutional: Negative for chills and fever.  HENT: Negative for congestion, sinus pain, sinus pressure, sore throat and trouble swallowing.   Eyes: Positive for visual disturbance.  Respiratory: Negative for cough.   Neurological: Positive for headaches. Negative for dizziness, speech difficulty and weakness.   Physical Exam   Blood pressure 125/75, pulse 84, temperature 98.5 F (36.9 C), temperature source Oral, resp. rate 18, height  (1.651 m), weight 70.3 kg (155 lb), last menstrual period 11/12/2015, SpO2 100 %, unknown if currently breastfeeding.  Physical Exam  Constitutional: She is oriented to person, place, and time. She appears well-developed and well-nourished. No distress.  HENT:  Head: Normocephalic and atraumatic. Not macrocephalic and not microcephalic. Head is without abrasion, without contusion, without laceration, without right periorbital erythema and without left periorbital erythema. Hair is normal.  Nose: Right sinus exhibits no maxillary sinus tenderness and no frontal sinus tenderness. Left sinus exhibits no maxillary sinus tenderness and no frontal sinus tenderness.  Eyes: Conjunctivae and EOM are normal. Pupils are equal, round, and reactive to light.  Neck: Normal range of motion. Neck supple. No thyromegaly present.  Cardiovascular: Normal rate.   Respiratory: Effort normal.  GI: Soft. She exhibits no distension. There is no tenderness.  gravid  Musculoskeletal: Normal range of motion. She exhibits no edema.  Neurological: She is alert and oriented to person, place, and time. She has normal reflexes. She displays no atrophy and no tremor. No cranial nerve deficit or sensory deficit. She exhibits normal muscle tone. Coordination and gait normal.  Skin: Skin is warm and dry.  Psychiatric: She has a normal mood and affect.   EFM: 150 bpm, mod variability, + accels, no decels Toco: none  Results for orders placed or  performed during the hospital encounter of 07/15/16 (from the past 24 hour(s))  Urinalysis, Routine w reflex microscopic     Status: Abnormal   Collection Time: 07/15/16  7:36 PM  Result Value Ref Range   Color, Urine YELLOW YELLOW   APPearance CLOUDY (A) CLEAR   Specific Gravity, Urine 1.014 1.005 - 1.030   pH 6.0 5.0 - 8.0   Glucose, UA >=500 (A) NEGATIVE mg/dL   Hgb urine dipstick NEGATIVE NEGATIVE   Bilirubin Urine NEGATIVE NEGATIVE   Ketones, ur 5 (A) NEGATIVE mg/dL   Protein, ur NEGATIVE NEGATIVE mg/dL   Nitrite NEGATIVE NEGATIVE   Leukocytes, UA MODERATE (A) NEGATIVE   RBC / HPF 0-5 0 - 5 RBC/hpf   WBC, UA TOO NUMEROUS TO COUNT 0 - 5 WBC/hpf   Bacteria, UA FEW (A) NONE SEEN   Squamous Epithelial / LPF TOO NUMEROUS TO COUNT (  A) NONE SEEN   Mucous PRESENT    MAU Course  Procedures  MDM Consult with Dr. Macon Large, recommends imaging and consulted with radiology-recommends MRI or CT w/o contrast (MRI better imaging modality). MRI would have to be done tomorrow since not done at [facility], discussed this with pt and she will not have childcare, she prefers CT tonight.   Transfer of care to M.Maris Berger Donette Larry, CNM  07/15/2016 10:07 PM   Assessment and Plan  Reviewed CT results are normal Declines medication for headache right now.  Will Rx Fioricet and Flexeril for headache. Recommend trying Flexeril first.  Then if it does not work, stop that and try Fioricet Keep appt for followup as scheduled Encouraged to return here or to other Urgent Care/ED if she develops worsening of symptoms, increase in pain, fever, or other concerning symptoms.    Marland Kitchen

## 2016-07-15 NOTE — Discharge Instructions (Signed)
General Headache Without Cause A headache is pain or discomfort felt around the head or neck area. The specific cause of a headache may not be found. There are many causes and types of headaches. A few common ones are:  Tension headaches.  Migraine headaches.  Cluster headaches.  Chronic daily headaches.  Follow these instructions at home: Watch your condition for any changes. Take these steps to help with your condition: Managing pain  Take over-the-counter and prescription medicines only as told by your health care provider.  Lie down in a dark, quiet room when you have a headache.  If directed, apply ice to the head and neck area: ? Put ice in a plastic bag. ? Place a towel between your skin and the bag. ? Leave the ice on for 20 minutes, 2-3 times per day.  Use a heating pad or hot shower to apply heat to the head and neck area as told by your health care provider.  Keep lights dim if bright lights bother you or make your headaches worse. Eating and drinking  Eat meals on a regular schedule.  Limit alcohol use.  Decrease the amount of caffeine you drink, or stop drinking caffeine. General instructions  Keep all follow-up visits as told by your health care provider. This is important.  Keep a headache journal to help find out what may trigger your headaches. For example, write down: ? What you eat and drink. ? How much sleep you get. ? Any change to your diet or medicines.  Try massage or other relaxation techniques.  Limit stress.  Sit up straight, and do not tense your muscles.  Do not use tobacco products, including cigarettes, chewing tobacco, or e-cigarettes. If you need help quitting, ask your health care provider.  Exercise regularly as told by your health care provider.  Sleep on a regular schedule. Get 7-9 hours of sleep, or the amount recommended by your health care provider. Contact a health care provider if:  Your symptoms are not helped by  medicine.  You have a headache that is different from the usual headache.  You have nausea or you vomit.  You have a fever. Get help right away if:  Your headache becomes severe.  You have repeated vomiting.  You have a stiff neck.  You have a loss of vision.  You have problems with speech.  You have pain in the eye or ear.  You have muscular weakness or loss of muscle control.  You lose your balance or have trouble walking.  You feel faint or pass out.  You have confusion. This information is not intended to replace advice given to you by your health care provider. Make sure you discuss any questions you have with your health care provider. Document Released: 03/09/2005 Document Revised: 08/15/2015 Document Reviewed: 07/02/2014 Elsevier Interactive Patient Education  2017 Elsevier Inc.  

## 2016-07-16 ENCOUNTER — Ambulatory Visit (HOSPITAL_COMMUNITY): Payer: Self-pay | Admitting: Psychiatry

## 2016-07-17 ENCOUNTER — Other Ambulatory Visit (HOSPITAL_COMMUNITY)
Admission: RE | Admit: 2016-07-17 | Discharge: 2016-07-17 | Disposition: A | Discharge status: Home / Self Care | Payer: Medicaid Other | Source: Ambulatory Visit | Attending: Family Medicine | Admitting: Family Medicine

## 2016-07-17 ENCOUNTER — Ambulatory Visit (INDEPENDENT_AMBULATORY_CARE_PROVIDER_SITE_OTHER): Payer: Medicaid Other | Admitting: Family Medicine

## 2016-07-17 VITALS — BP 126/70 | HR 87 | Wt 156.0 lb

## 2016-07-17 DIAGNOSIS — Z3490 Encounter for supervision of normal pregnancy, unspecified, unspecified trimester: Secondary | ICD-10-CM | POA: Diagnosis present

## 2016-07-17 DIAGNOSIS — M4310 Spondylolisthesis, site unspecified: Secondary | ICD-10-CM

## 2016-07-17 DIAGNOSIS — T8189XS Other complications of procedures, not elsewhere classified, sequela: Secondary | ICD-10-CM

## 2016-07-17 DIAGNOSIS — O0990 Supervision of high risk pregnancy, unspecified, unspecified trimester: Secondary | ICD-10-CM

## 2016-07-17 DIAGNOSIS — Z3483 Encounter for supervision of other normal pregnancy, third trimester: Secondary | ICD-10-CM

## 2016-07-17 DIAGNOSIS — O34219 Maternal care for unspecified type scar from previous cesarean delivery: Secondary | ICD-10-CM

## 2016-07-17 DIAGNOSIS — R519 Headache, unspecified: Secondary | ICD-10-CM

## 2016-07-17 DIAGNOSIS — O358XX0 Maternal care for other (suspected) fetal abnormality and damage, not applicable or unspecified: Secondary | ICD-10-CM

## 2016-07-17 DIAGNOSIS — R51 Headache: Secondary | ICD-10-CM | POA: Diagnosis not present

## 2016-07-17 DIAGNOSIS — I82409 Acute embolism and thrombosis of unspecified deep veins of unspecified lower extremity: Secondary | ICD-10-CM

## 2016-07-17 DIAGNOSIS — O9989 Other specified diseases and conditions complicating pregnancy, childbirth and the puerperium: Secondary | ICD-10-CM | POA: Diagnosis not present

## 2016-07-17 DIAGNOSIS — M9909 Segmental and somatic dysfunction of abdomen and other regions: Secondary | ICD-10-CM

## 2016-07-17 DIAGNOSIS — O35EXX0 Maternal care for other (suspected) fetal abnormality and damage, fetal genitourinary anomalies, not applicable or unspecified: Secondary | ICD-10-CM

## 2016-07-17 NOTE — Progress Notes (Signed)
   PRENATAL VISIT NOTE  Subjective:  Melinda Higgins is a 26 y.o. [phone] at [redacted]w[redacted]d being seen today for ongoing prenatal care.  She is currently monitored for the following issues for this low-risk pregnancy and has Supervision of high risk pregnancy, antepartum; Nausea and vomiting of pregnancy, antepartum; Previous cesarean delivery affecting pregnancy, antepartum; Postoperative deep vein thrombosis (DVT) (HCC); False positive HIV serology; Cesarean section wound complication; History of spinal fusion; Spondylolisthesis; Asthma; Family history of congenital anomaly of genitourinary system; Scoliosis; Dizziness; Fetal renal anomaly, single gestation; and Pregnancy complicated by fetal cerebral ventriculomegaly, single gestation on her problem list.  Patient reports headache in the back of head x3 weeks. Was evaluated in MAU. Intermittent. Nonradiating. Increases with mastication, head movements..  Contractions: Irritability. Vag. Bleeding: None.  Movement: Present. Denies leaking of fluid.   The following portions of the patient's history were reviewed and updated as appropriate: allergies, current medications, past family history, past medical history, past social history, past surgical history and problem list. Problem list updated.  Objective:   Vitals:   07/17/16 0912  BP: 126/70  Pulse: 87  Weight: 156 lb (70.8 kg)    Fetal Status:     Movement: Present     General:  Alert, oriented and cooperative. Patient is in no acute distress.  Skin: Skin is warm and dry. No rash noted.   Cardiovascular: Normal heart rate noted  Respiratory: Normal respiratory effort, no problems with respiration noted  Abdomen: Soft, gravid, appropriate for gestational age. Pain/Pressure: Present     Pelvic:  Cervical exam deferred        MSK: Restriction, tenderness, tissue texture changes, and paraspinal spasm in the cervical, thoracic, and lumbar spine  Neuro: Moves all four extremities with no focal  neurological deficit  Extremities: Normal range of motion.  Edema: None  Mental Status: Normal mood and affect. Normal behavior. Normal judgment and thought content.   OSE: Head OA: FSRRL  Cervical C3: FSRL  Thoracic T4: FSRL, T7: FSRR  Rib T4: L inhaled  Lumbar  L5: ESRR  Sacrum L/L  Pelvis Right ant    Assessment and Plan:  Pregnancy: [id number] at [redacted]w[redacted]d  1. Supervision of high risk pregnancy, antepartum FHT and FH normal. 36 week labs done  2. Prenatal care, antepartum - Culture, beta strep (group b only) - GC/Chlamydia probe amp ([place])not at [facility]  3. Spondylolisthesis, unspecified spinal region  4. Fetal renal anomaly, single gestation f/u kidneys post delivery  5. Postoperative deep vein thrombosis (DVT), sequela (HCC) Continue lovenox  6. Previous cesarean delivery affecting pregnancy, antepartum Plan for repeat  7. Nonintractable episodic headache, unspecified headache type 8. Somatic dysfuncton of other region OMT done after patient permission. HVLA technique utilized. Patient tolerated procedure well.   Preterm labor symptoms and general obstetric precautions including but not limited to vaginal bleeding, contractions, leaking of fluid and fetal movement were reviewed in detail with the patient. Please refer to After Visit Summary for other counseling recommendations.  No Follow-up on file.  Levie Heritage, DO

## 2016-07-20 LAB — GC/CHLAMYDIA PROBE AMP (~~LOC~~) NOT AT ARMC
CHLAMYDIA, DNA PROBE: NEGATIVE
NEISSERIA GONORRHEA: NEGATIVE

## 2016-07-20 LAB — CULTURE, BETA STREP (GROUP B ONLY): STREP GP B CULTURE: POSITIVE — AB

## 2016-07-22 ENCOUNTER — Encounter: Payer: Self-pay | Admitting: Obstetrics & Gynecology

## 2016-07-24 ENCOUNTER — Encounter: Payer: Self-pay | Admitting: Family Medicine

## 2016-07-31 ENCOUNTER — Ambulatory Visit (INDEPENDENT_AMBULATORY_CARE_PROVIDER_SITE_OTHER): Payer: Medicaid Other | Admitting: Family Medicine

## 2016-07-31 ENCOUNTER — Other Ambulatory Visit: Payer: Self-pay | Admitting: Family Medicine

## 2016-07-31 VITALS — BP 113/73 | HR 85 | Wt 156.0 lb

## 2016-07-31 DIAGNOSIS — O34219 Maternal care for unspecified type scar from previous cesarean delivery: Secondary | ICD-10-CM

## 2016-07-31 DIAGNOSIS — I82409 Acute embolism and thrombosis of unspecified deep veins of unspecified lower extremity: Secondary | ICD-10-CM

## 2016-07-31 DIAGNOSIS — J4540 Moderate persistent asthma, uncomplicated: Secondary | ICD-10-CM

## 2016-07-31 DIAGNOSIS — O0990 Supervision of high risk pregnancy, unspecified, unspecified trimester: Secondary | ICD-10-CM

## 2016-07-31 DIAGNOSIS — T8189XS Other complications of procedures, not elsewhere classified, sequela: Secondary | ICD-10-CM

## 2016-07-31 MED ORDER — BUPIVACAINE 0.5 % ON-Q PUMP SINGLE CATH 300 ML: 300.0000 mL | INJECTION | Status: DC

## 2016-07-31 NOTE — Progress Notes (Signed)
   PRENATAL VISIT NOTE  Subjective:  Melinda Higgins is a 26 y.o. [phone] at 8547w3d being seen today for ongoing prenatal care.  She is currently monitored for the following issues for this high-risk pregnancy and has Supervision of high risk pregnancy, antepartum; Nausea and vomiting of pregnancy, antepartum; Previous cesarean delivery affecting pregnancy, antepartum; Postoperative deep vein thrombosis (DVT) (HCC); False positive HIV serology; Cesarean section wound complication; History of spinal fusion; Spondylolisthesis; Asthma; Family history of congenital anomaly of genitourinary system; Scoliosis; Dizziness; Fetal renal anomaly, single gestation; and Pregnancy complicated by fetal cerebral ventriculomegaly, single gestation on her problem list.  Patient reports occasional contractions. Had yeast infection last week - contractions stopped after taking OTC monistat.  Contractions: Irritability. Vag. Bleeding: None.  Movement: Present. Denies leaking of fluid.   The following portions of the patient's history were reviewed and updated as appropriate: allergies, current medications, past family history, past medical history, past social history, past surgical history and problem list. Problem list updated.  Objective:   Vitals:   07/31/16 0931  BP: 113/73  Pulse: 85  Weight: 156 lb (70.8 kg)    Fetal Status: Fetal Heart Rate (bpm): 131   Movement: Present     General:  Alert, oriented and cooperative. Patient is in no acute distress.  Skin: Skin is warm and dry. No rash noted.   Cardiovascular: Normal heart rate noted  Respiratory: Normal respiratory effort, no problems with respiration noted  Abdomen: Soft, gravid, appropriate for gestational age. Pain/Pressure: Present     Pelvic:  Cervical exam deferred        Extremities: Normal range of motion.  Edema: None  Mental Status: Normal mood and affect. Normal behavior. Normal judgment and thought content.   Assessment and Plan:    Pregnancy: [id number] at 2347w3d  1. Supervision of high risk pregnancy, antepartum FHT and FH normal.  2. Postoperative deep vein thrombosis (DVT), sequela (HCC) On lovenox  3. Previous cesarean delivery affecting pregnancy, antepartum Planned for repeat c/s on 5/22. Husband having vasectomy.  4. Moderate persistent asthma without complication Controlled.   Term labor symptoms and general obstetric precautions including but not limited to vaginal bleeding, contractions, leaking of fluid and fetal movement were reviewed in detail with the patient. Please refer to After Visit Summary for other counseling recommendations.  No Follow-up on file.   [name], Douglas Smolinsky J, DO

## 2016-08-03 ENCOUNTER — Encounter (HOSPITAL_COMMUNITY): Payer: Self-pay

## 2016-08-07 ENCOUNTER — Ambulatory Visit (INDEPENDENT_AMBULATORY_CARE_PROVIDER_SITE_OTHER): Payer: Medicaid Other | Admitting: Family Medicine

## 2016-08-07 VITALS — BP 113/73 | HR 82 | Wt 157.0 lb

## 2016-08-07 DIAGNOSIS — L918 Other hypertrophic disorders of the skin: Secondary | ICD-10-CM

## 2016-08-07 DIAGNOSIS — I82409 Acute embolism and thrombosis of unspecified deep veins of unspecified lower extremity: Secondary | ICD-10-CM

## 2016-08-07 DIAGNOSIS — O34219 Maternal care for unspecified type scar from previous cesarean delivery: Secondary | ICD-10-CM

## 2016-08-07 DIAGNOSIS — O0990 Supervision of high risk pregnancy, unspecified, unspecified trimester: Secondary | ICD-10-CM

## 2016-08-07 DIAGNOSIS — T8189XS Other complications of procedures, not elsewhere classified, sequela: Secondary | ICD-10-CM

## 2016-08-07 DIAGNOSIS — O0993 Supervision of high risk pregnancy, unspecified, third trimester: Secondary | ICD-10-CM

## 2016-08-07 DIAGNOSIS — J4540 Moderate persistent asthma, uncomplicated: Secondary | ICD-10-CM

## 2016-08-07 DIAGNOSIS — O358XX0 Maternal care for other (suspected) fetal abnormality and damage, not applicable or unspecified: Secondary | ICD-10-CM

## 2016-08-07 DIAGNOSIS — O35EXX0 Maternal care for other (suspected) fetal abnormality and damage, fetal genitourinary anomalies, not applicable or unspecified: Secondary | ICD-10-CM

## 2016-08-07 NOTE — Progress Notes (Signed)
   PRENATAL VISIT NOTE  Subjective:  Melinda Higgins is a 26 y.o. [phone] at 4811w3d being seen today for ongoing prenatal care.  She is currently monitored for the following issues for this high-risk pregnancy and has Supervision of high risk pregnancy, antepartum; Nausea and vomiting of pregnancy, antepartum; Previous cesarean delivery affecting pregnancy, antepartum; Postoperative deep vein thrombosis (DVT) (HCC); False positive HIV serology; Cesarean section wound complication; History of spinal fusion; Spondylolisthesis; Asthma; Family history of congenital anomaly of genitourinary system; Scoliosis; Dizziness; Fetal renal anomaly, single gestation; and Pregnancy complicated by fetal cerebral ventriculomegaly, single gestation on her problem list.  Patient reports skin tag on right medial thigh that is painful to the touch. Has increased in size throughout the pregnancy..  Contractions: Irritability. Vag. Bleeding: None.  Movement: Present. Denies leaking of fluid.   The following portions of the patient's history were reviewed and updated as appropriate: allergies, current medications, past family history, past medical history, past social history, past surgical history and problem list. Problem list updated.  Objective:   Vitals:   08/07/16 1000  BP: 113/73  Pulse: 82  Weight: 157 lb (71.2 kg)    Fetal Status:   Fundal Height: 37 cm Movement: Present     General:  Alert, oriented and cooperative. Patient is in no acute distress.  Skin: Skin is warm and dry. No rash noted.   Cardiovascular: Normal heart rate noted  Respiratory: Normal respiratory effort, no problems with respiration noted  Abdomen: Soft, gravid, appropriate for gestational age. Pain/Pressure: Present     Pelvic:  Cervical exam deferred        Skin: Inflamed skin tag about 1 cm in size  Extremities: Normal range of motion.  Edema: Trace  Mental Status: Normal mood and affect. Normal behavior. Normal judgment and  thought content.   Assessment and Plan:  Pregnancy: [id number] at 4411w3d  1. Supervision of high risk pregnancy, antepartum FHT and Fh normal  2. Postoperative deep vein thrombosis (DVT), sequela (HCC) Continue lovenox - do not take lovenox the night prior to scheduled section  3. Previous cesarean delivery affecting pregnancy, antepartum Repeat c/s on 5/22.  4. Moderate persistent asthma without complication Controlled  5. Fetal renal anomaly, single gestation F/u post delivery  6. Inflamed skin tag After patient consent, skin tag removed - 1mL lidocaine with epi injected. Silver nitrite used to stop bleeding. 4x4 placed with pressure dressing. Specimen not sent.  Term labor symptoms and general obstetric precautions including but not limited to vaginal bleeding, contractions, leaking of fluid and fetal movement were reviewed in detail with the patient. Please refer to After Visit Summary for other counseling recommendations.  No Follow-up on file.   Levie HeritageJacob J Damontay Alred, DO

## 2016-08-07 NOTE — Progress Notes (Signed)
[facility]

## 2016-08-10 ENCOUNTER — Encounter (HOSPITAL_COMMUNITY): Payer: Self-pay

## 2016-08-10 ENCOUNTER — Encounter (HOSPITAL_COMMUNITY)
Admission: RE | Admit: 2016-08-10 | Discharge: 2016-08-10 | Disposition: A | Discharge status: Home / Self Care | Payer: Medicaid Other | Source: Ambulatory Visit | Attending: Family Medicine | Admitting: Family Medicine

## 2016-08-10 LAB — CBC
HEMATOCRIT: 30.5 % — AB (ref 36.0–46.0)
HEMOGLOBIN: 9.9 g/dL — AB (ref 12.0–15.0)
MCH: 25.7 pg — ABNORMAL LOW (ref 26.0–34.0)
MCHC: 32.5 g/dL (ref 30.0–36.0)
MCV: 79.2 fL (ref 78.0–100.0)
PLATELETS: 137 10*3/uL — AB (ref 150–400)
RBC: 3.85 MIL/uL — AB (ref 3.87–5.11)
RDW: 16.9 % — ABNORMAL HIGH (ref 11.5–15.5)
WBC: 9.1 10*3/uL (ref 4.0–10.5)

## 2016-08-10 LAB — TYPE AND SCREEN
ABO/RH(D): AB POS
ANTIBODY SCREEN: NEGATIVE

## 2016-08-10 LAB — RAPID HIV SCREEN (HIV 1/2 AB+AG)
HIV 1/2 Antibodies: NONREACTIVE
HIV-1 P24 Antigen - HIV24: NONREACTIVE

## 2016-08-10 NOTE — Patient Instructions (Signed)
20 Melinda Higgins  08/10/2016   Your procedure is scheduled on:  08/11/2016  [facility]30[facility].  Pick up the phone at the desk and dial [phone].   Call this number if you have problems the morning of surgery: [phone]   Remember:   Do not eat food:After Midnight.  Do not drink clear liquids: After Midnight.  Take these medicines the morning of surgery with A SIP OF WATER: none   Do not wear jewelry, make-up or nail polish.  Do not wear lotions, powders, or perfumes. Do not wear deodorant.  Do not shave 48 hours prior to surgery.  Do not bring valuables to the hospital.  [facility] not   responsible for any belongings or valuables brought to the hospital.  Contacts, dentures or bridgework may not be worn into surgery.  Leave suitcase in the car. After surgery it may be brought to your room.  For patients admitted to the hospital, checkout time is 11:00 AM the day of              discharge.   Patients discharged the day of surgery will not be allowed to drive             home.  Name and phone number of your driver: na  Special Instructions:   N/A   Please read over the following fact sheets that you were given:   Surgical Site Infection Prevention

## 2016-08-11 ENCOUNTER — Encounter (HOSPITAL_COMMUNITY): Payer: Self-pay | Admitting: *Deleted

## 2016-08-11 ENCOUNTER — Inpatient Hospital Stay (HOSPITAL_COMMUNITY): Payer: Medicaid Other | Admitting: Anesthesiology

## 2016-08-11 ENCOUNTER — Inpatient Hospital Stay (HOSPITAL_COMMUNITY)
Admission: RE | Admit: 2016-08-11 | Discharge: 2016-08-13 | DRG: 766 | Disposition: A | Discharge status: Home / Self Care | Payer: Medicaid Other | Source: Ambulatory Visit | Attending: Family Medicine | Admitting: Family Medicine

## 2016-08-11 ENCOUNTER — Encounter (HOSPITAL_COMMUNITY)
Admission: RE | Disposition: A | Discharge status: Home / Self Care | Payer: Self-pay | Source: Ambulatory Visit | Attending: Family Medicine

## 2016-08-11 DIAGNOSIS — O9952 Diseases of the respiratory system complicating childbirth: Secondary | ICD-10-CM | POA: Diagnosis present

## 2016-08-11 DIAGNOSIS — Z8249 Family history of ischemic heart disease and other diseases of the circulatory system: Secondary | ICD-10-CM

## 2016-08-11 DIAGNOSIS — Z833 Family history of diabetes mellitus: Secondary | ICD-10-CM | POA: Diagnosis not present

## 2016-08-11 DIAGNOSIS — Z86718 Personal history of other venous thrombosis and embolism: Secondary | ICD-10-CM

## 2016-08-11 DIAGNOSIS — O358XX0 Maternal care for other (suspected) fetal abnormality and damage, not applicable or unspecified: Secondary | ICD-10-CM | POA: Diagnosis present

## 2016-08-11 DIAGNOSIS — O3509X0 Maternal care for (suspected) other central nervous system malformation or damage in fetus, not applicable or unspecified: Secondary | ICD-10-CM

## 2016-08-11 DIAGNOSIS — O34211 Maternal care for low transverse scar from previous cesarean delivery: Principal | ICD-10-CM | POA: Diagnosis present

## 2016-08-11 DIAGNOSIS — J45909 Unspecified asthma, uncomplicated: Secondary | ICD-10-CM | POA: Diagnosis present

## 2016-08-11 DIAGNOSIS — Z3A39 39 weeks gestation of pregnancy: Secondary | ICD-10-CM

## 2016-08-11 DIAGNOSIS — O35EXX0 Maternal care for other (suspected) fetal abnormality and damage, fetal genitourinary anomalies, not applicable or unspecified: Secondary | ICD-10-CM

## 2016-08-11 DIAGNOSIS — O350XX0 Maternal care for (suspected) central nervous system malformation in fetus, not applicable or unspecified: Secondary | ICD-10-CM

## 2016-08-11 DIAGNOSIS — O0990 Supervision of high risk pregnancy, unspecified, unspecified trimester: Secondary | ICD-10-CM

## 2016-08-11 LAB — CBC
HCT: 31.3 % — ABNORMAL LOW (ref 36.0–46.0)
Hemoglobin: 10.4 g/dL — ABNORMAL LOW (ref 12.0–15.0)
MCH: 25.9 pg — ABNORMAL LOW (ref 26.0–34.0)
MCHC: 33.2 g/dL (ref 30.0–36.0)
MCV: 77.9 fL — AB (ref 78.0–100.0)
PLATELETS: 150 10*3/uL (ref 150–400)
RBC: 4.02 MIL/uL (ref 3.87–5.11)
RDW: 16.7 % — ABNORMAL HIGH (ref 11.5–15.5)
WBC: 9.7 10*3/uL (ref 4.0–10.5)

## 2016-08-11 LAB — RPR: RPR Ser Ql: NONREACTIVE

## 2016-08-11 SURGERY — Surgical Case
Anesthesia: Spinal | Site: Abdomen | Wound class: Clean Contaminated

## 2016-08-11 MED ORDER — CHLOROPROCAINE HCL (PF) 3 % IJ SOLN
INTRAMUSCULAR | Status: AC
  Filled 2016-08-11: qty 40

## 2016-08-11 MED ORDER — MORPHINE SULFATE (PF) 0.5 MG/ML IJ SOLN
INTRAMUSCULAR | Status: DC | PRN
  Administered 2016-08-11: .1 mg via INTRATHECAL

## 2016-08-11 MED ORDER — OXYCODONE HCL 5 MG PO TABS
5.0000 mg | ORAL_TABLET | ORAL | Status: DC | PRN
  Administered 2016-08-12 (×3): 5 mg via ORAL
  Filled 2016-08-11 (×3): qty 1

## 2016-08-11 MED ORDER — ZOLPIDEM TARTRATE 5 MG PO TABS: 5.0000 mg | ORAL_TABLET | Freq: Every evening | ORAL | Status: DC | PRN

## 2016-08-11 MED ORDER — ACETAMINOPHEN 325 MG PO TABS
650.0000 mg | ORAL_TABLET | ORAL | Status: DC | PRN
  Administered 2016-08-11: 650 mg via ORAL
  Filled 2016-08-11: qty 2

## 2016-08-11 MED ORDER — PHENYLEPHRINE HCL 10 MG/ML IJ SOLN
INTRAMUSCULAR | Status: DC | PRN
  Administered 2016-08-11 (×2): 40 ug via INTRAVENOUS

## 2016-08-11 MED ORDER — SODIUM CHLORIDE 0.9 % IR SOLN
Status: DC | PRN
  Administered 2016-08-11: 1

## 2016-08-11 MED ORDER — COCONUT OIL OIL: 1.0000 "application " | TOPICAL_OIL | Status: DC | PRN

## 2016-08-11 MED ORDER — HYDROMORPHONE HCL 1 MG/ML IJ SOLN: 0.2500 mg | INTRAMUSCULAR | Status: DC | PRN

## 2016-08-11 MED ORDER — SENNOSIDES-DOCUSATE SODIUM 8.6-50 MG PO TABS
2.0000 | ORAL_TABLET | ORAL | Status: DC
  Administered 2016-08-12 – 2016-08-13 (×2): 2 via ORAL
  Filled 2016-08-11 (×2): qty 2

## 2016-08-11 MED ORDER — SODIUM CHLORIDE 0.9% FLUSH: 3.0000 mL | INTRAVENOUS | Status: DC | PRN

## 2016-08-11 MED ORDER — OXYTOCIN 40 UNITS IN LACTATED RINGERS INFUSION - SIMPLE MED
2.5000 [IU]/h | INTRAVENOUS | Status: AC
  Administered 2016-08-11: 2.5 [IU]/h via INTRAVENOUS

## 2016-08-11 MED ORDER — CHLOROPROCAINE HCL (PF) 3 % IJ SOLN
INTRAMUSCULAR | Status: AC
  Filled 2016-08-11: qty 20

## 2016-08-11 MED ORDER — OXYTOCIN 10 UNIT/ML IJ SOLN
INTRAMUSCULAR | Status: AC
  Filled 2016-08-11: qty 4

## 2016-08-11 MED ORDER — ACETAMINOPHEN 160 MG/5ML PO SOLN
975.0000 mg | Freq: Once | ORAL | Status: AC
Start: 2016-08-11 — End: 2016-08-11
  Administered 2016-08-11: 975 mg via ORAL
  Filled 2016-08-11: qty 40.6

## 2016-08-11 MED ORDER — IBUPROFEN 600 MG PO TABS
600.0000 mg | ORAL_TABLET | Freq: Four times a day (QID) | ORAL | Status: DC
  Administered 2016-08-11 – 2016-08-13 (×7): 600 mg via ORAL
  Filled 2016-08-11 (×8): qty 1

## 2016-08-11 MED ORDER — CHLOROPROCAINE HCL (PF) 3 % IJ SOLN
INTRAMUSCULAR | Status: DC | PRN
  Administered 2016-08-11: 20 mL

## 2016-08-11 MED ORDER — LACTATED RINGERS IV SOLN
INTRAVENOUS | Status: DC
  Administered 2016-08-11 (×3): via INTRAVENOUS

## 2016-08-11 MED ORDER — NALBUPHINE HCL 10 MG/ML IJ SOLN: 5.0000 mg | INTRAMUSCULAR | Status: DC | PRN

## 2016-08-11 MED ORDER — LACTATED RINGERS IV SOLN
INTRAVENOUS | Status: DC
  Administered 2016-08-11 (×2): via INTRAVENOUS

## 2016-08-11 MED ORDER — ENOXAPARIN SODIUM 120 MG/0.8ML ~~LOC~~ SOLN
1.5000 mg/kg | SUBCUTANEOUS | Status: DC
  Administered 2016-08-12 – 2016-08-13 (×2): 105 mg via SUBCUTANEOUS
  Filled 2016-08-11 (×3): qty 0.8

## 2016-08-11 MED ORDER — DIPHENHYDRAMINE HCL 25 MG PO CAPS: 25.0000 mg | ORAL_CAPSULE | ORAL | Status: DC | PRN

## 2016-08-11 MED ORDER — BUPIVACAINE IN DEXTROSE 0.75-8.25 % IT SOLN
INTRATHECAL | Status: AC
  Filled 2016-08-11: qty 2

## 2016-08-11 MED ORDER — OXYTOCIN 40 UNITS IN LACTATED RINGERS INFUSION - SIMPLE MED
INTRAVENOUS | Status: AC
  Administered 2016-08-11: 2.5 [IU]/h via INTRAVENOUS
  Filled 2016-08-11: qty 1000

## 2016-08-11 MED ORDER — MORPHINE SULFATE (PF) 0.5 MG/ML IJ SOLN
INTRAMUSCULAR | Status: AC
  Filled 2016-08-11: qty 10

## 2016-08-11 MED ORDER — LIDOCAINE-EPINEPHRINE (PF) 2 %-1:200000 IJ SOLN
INTRAMUSCULAR | Status: DC | PRN
  Administered 2016-08-11: 1 mL via INTRADERMAL
  Administered 2016-08-11: 3 mL via INTRADERMAL

## 2016-08-11 MED ORDER — ACETAMINOPHEN 500 MG PO TABS: 1000.0000 mg | ORAL_TABLET | Freq: Four times a day (QID) | ORAL | Status: DC

## 2016-08-11 MED ORDER — DIPHENHYDRAMINE HCL 25 MG PO CAPS: 25.0000 mg | ORAL_CAPSULE | Freq: Four times a day (QID) | ORAL | Status: DC | PRN

## 2016-08-11 MED ORDER — ALBUTEROL SULFATE (2.5 MG/3ML) 0.083% IN NEBU: 3.0000 mL | INHALATION_SOLUTION | Freq: Four times a day (QID) | RESPIRATORY_TRACT | Status: DC | PRN

## 2016-08-11 MED ORDER — DIBUCAINE 1 % RE OINT: 1.0000 "application " | TOPICAL_OINTMENT | RECTAL | Status: DC | PRN

## 2016-08-11 MED ORDER — LACTATED RINGERS IV SOLN
125.0000 mL/h | INTRAVENOUS | Status: DC
  Administered 2016-08-11: 12:00:00 via INTRAVENOUS

## 2016-08-11 MED ORDER — ONDANSETRON HCL 4 MG/2ML IJ SOLN: 4.0000 mg | Freq: Three times a day (TID) | INTRAMUSCULAR | Status: DC | PRN

## 2016-08-11 MED ORDER — NALOXONE HCL 0.4 MG/ML IJ SOLN: 0.4000 mg | INTRAMUSCULAR | Status: DC | PRN

## 2016-08-11 MED ORDER — MENTHOL 3 MG MT LOZG: 1.0000 | LOZENGE | OROMUCOSAL | Status: DC | PRN

## 2016-08-11 MED ORDER — BUPIVACAINE 0.5 % ON-Q PUMP SINGLE CATH 300 ML
300.0000 mL | INJECTION | Status: DC
  Filled 2016-08-11: qty 300

## 2016-08-11 MED ORDER — SIMETHICONE 80 MG PO CHEW
80.0000 mg | CHEWABLE_TABLET | Freq: Three times a day (TID) | ORAL | Status: DC
  Administered 2016-08-11 – 2016-08-13 (×5): 80 mg via ORAL
  Filled 2016-08-11 (×6): qty 1

## 2016-08-11 MED ORDER — WITCH HAZEL-GLYCERIN EX PADS: 1.0000 "application " | MEDICATED_PAD | CUTANEOUS | Status: DC | PRN

## 2016-08-11 MED ORDER — BUPIVACAINE HCL (PF) 0.5 % IJ SOLN
INTRAMUSCULAR | Status: AC
  Filled 2016-08-11: qty 30

## 2016-08-11 MED ORDER — PHENYLEPHRINE 8 MG IN D5W 100 ML (0.08MG/ML) PREMIX OPTIME
INJECTION | INTRAVENOUS | Status: DC | PRN
  Administered 2016-08-11: 60 ug/min via INTRAVENOUS

## 2016-08-11 MED ORDER — METOCLOPRAMIDE HCL 10 MG PO TABS
10.0000 mg | ORAL_TABLET | Freq: Once | ORAL | Status: AC | PRN
  Administered 2016-08-11: 10 mg via ORAL
  Filled 2016-08-11: qty 1

## 2016-08-11 MED ORDER — SERTRALINE HCL 50 MG PO TABS
50.0000 mg | ORAL_TABLET | Freq: Every day | ORAL | Status: DC
  Administered 2016-08-12 – 2016-08-13 (×2): 50 mg via ORAL
  Filled 2016-08-11 (×4): qty 1

## 2016-08-11 MED ORDER — OXYCODONE HCL 5 MG PO TABS: 10.0000 mg | ORAL_TABLET | ORAL | Status: DC | PRN

## 2016-08-11 MED ORDER — SCOPOLAMINE 1 MG/3DAYS TD PT72
1.0000 | MEDICATED_PATCH | Freq: Once | TRANSDERMAL | Status: DC | PRN
  Administered 2016-08-11: 1.5 mg via TRANSDERMAL
  Filled 2016-08-11: qty 1

## 2016-08-11 MED ORDER — PHENYLEPHRINE 8 MG IN D5W 100 ML (0.08MG/ML) PREMIX OPTIME
INJECTION | INTRAVENOUS | Status: AC
  Filled 2016-08-11: qty 100

## 2016-08-11 MED ORDER — BUPIVACAINE HCL (PF) 0.5 % IJ SOLN
INTRAMUSCULAR | Status: DC | PRN
  Administered 2016-08-11: 10 mL

## 2016-08-11 MED ORDER — TETANUS-DIPHTH-ACELL PERTUSSIS 5-2.5-18.5 LF-MCG/0.5 IM SUSP: 0.5000 mL | Freq: Once | INTRAMUSCULAR | Status: DC

## 2016-08-11 MED ORDER — LACTATED RINGERS IV SOLN
INTRAVENOUS | Status: DC | PRN
  Administered 2016-08-11: 40 [IU] via INTRAVENOUS

## 2016-08-11 MED ORDER — SIMETHICONE 80 MG PO CHEW
80.0000 mg | CHEWABLE_TABLET | ORAL | Status: DC
  Administered 2016-08-12 – 2016-08-13 (×2): 80 mg via ORAL
  Filled 2016-08-11 (×2): qty 1

## 2016-08-11 MED ORDER — MOMETASONE FURO-FORMOTEROL FUM 200-5 MCG/ACT IN AERO
2.0000 | INHALATION_SPRAY | Freq: Two times a day (BID) | RESPIRATORY_TRACT | Status: DC
  Administered 2016-08-11 – 2016-08-13 (×4): 2 via RESPIRATORY_TRACT
  Filled 2016-08-11: qty 8.8

## 2016-08-11 MED ORDER — DIPHENHYDRAMINE HCL 50 MG/ML IJ SOLN: 12.5000 mg | INTRAMUSCULAR | Status: DC | PRN

## 2016-08-11 MED ORDER — FENTANYL CITRATE (PF) 100 MCG/2ML IJ SOLN
INTRAMUSCULAR | Status: DC | PRN
  Administered 2016-08-11: 25 ug via INTRATHECAL

## 2016-08-11 MED ORDER — NALBUPHINE HCL 10 MG/ML IJ SOLN: 5.0000 mg | Freq: Once | INTRAMUSCULAR | Status: DC | PRN

## 2016-08-11 MED ORDER — SIMETHICONE 80 MG PO CHEW: 80.0000 mg | CHEWABLE_TABLET | ORAL | Status: DC | PRN

## 2016-08-11 MED ORDER — MONTELUKAST SODIUM 10 MG PO TABS
10.0000 mg | ORAL_TABLET | Freq: Every day | ORAL | Status: DC
  Administered 2016-08-11 – 2016-08-12 (×2): 10 mg via ORAL
  Filled 2016-08-11 (×3): qty 1

## 2016-08-11 MED ORDER — MEPERIDINE HCL 25 MG/ML IJ SOLN: 6.2500 mg | INTRAMUSCULAR | Status: DC | PRN

## 2016-08-11 MED ORDER — ONDANSETRON HCL 4 MG/2ML IJ SOLN
INTRAMUSCULAR | Status: AC
  Filled 2016-08-11: qty 2

## 2016-08-11 MED ORDER — PHENYLEPHRINE 40 MCG/ML (10ML) SYRINGE FOR IV PUSH (FOR BLOOD PRESSURE SUPPORT)
PREFILLED_SYRINGE | INTRAVENOUS | Status: AC
  Filled 2016-08-11: qty 10

## 2016-08-11 MED ORDER — SCOPOLAMINE 1 MG/3DAYS TD PT72: 1.0000 | MEDICATED_PATCH | Freq: Once | TRANSDERMAL | Status: DC

## 2016-08-11 MED ORDER — PRENATAL MULTIVITAMIN CH
1.0000 | ORAL_TABLET | Freq: Every day | ORAL | Status: DC
  Administered 2016-08-12: 1 via ORAL
  Filled 2016-08-11 (×2): qty 1

## 2016-08-11 MED ORDER — NALOXONE HCL 2 MG/2ML IJ SOSY
1.0000 ug/kg/h | PREFILLED_SYRINGE | INTRAVENOUS | Status: DC | PRN
  Filled 2016-08-11: qty 2

## 2016-08-11 MED ORDER — FENTANYL CITRATE (PF) 100 MCG/2ML IJ SOLN
INTRAMUSCULAR | Status: AC
  Filled 2016-08-11: qty 2

## 2016-08-11 MED ORDER — CEFAZOLIN SODIUM-DEXTROSE 2-4 GM/100ML-% IV SOLN
2.0000 g | INTRAVENOUS | Status: AC
  Administered 2016-08-11: 2 g via INTRAVENOUS
  Filled 2016-08-11: qty 100

## 2016-08-11 SURGICAL SUPPLY — 34 items
BENZOIN TINCTURE PRP APPL 2/3 (GAUZE/BANDAGES/DRESSINGS) ×3 IMPLANT
CATH KIT ON-Q SILVERSOAK 5IN (CATHETERS) ×3 IMPLANT
CHLORAPREP W/TINT 26ML (MISCELLANEOUS) ×3 IMPLANT
CLAMP CORD UMBIL (MISCELLANEOUS) IMPLANT
CLOSURE STERI STRIP 1/2 X4 (GAUZE/BANDAGES/DRESSINGS) ×3 IMPLANT
CLOTH BEACON ORANGE TIMEOUT ST (SAFETY) ×3 IMPLANT
DRSG OPSITE POSTOP 4X10 (GAUZE/BANDAGES/DRESSINGS) ×3 IMPLANT
ELECT REM PT RETURN 9FT ADLT (ELECTROSURGICAL) ×3
ELECTRODE REM PT RTRN 9FT ADLT (ELECTROSURGICAL) ×1 IMPLANT
EXTRACTOR VACUUM M CUP 4 TUBE (SUCTIONS) IMPLANT
EXTRACTOR VACUUM M CUP 4' TUBE (SUCTIONS)
GAUZE SPONGE 4X4 12PLY STRL LF (GAUZE/BANDAGES/DRESSINGS) ×6 IMPLANT
GLOVE BIOGEL PI IND STRL 7.0 (GLOVE) ×2 IMPLANT
GLOVE BIOGEL PI IND STRL 7.5 (GLOVE) ×3 IMPLANT
GLOVE BIOGEL PI INDICATOR 7.0 (GLOVE) ×4
GLOVE BIOGEL PI INDICATOR 7.5 (GLOVE) ×6
GLOVE ECLIPSE 7.5 STRL STRAW (GLOVE) ×6 IMPLANT
GOWN STRL REUS W/TWL LRG LVL3 (GOWN DISPOSABLE) ×9 IMPLANT
KIT ABG SYR 3ML LUER SLIP (SYRINGE) IMPLANT
NEEDLE HYPO 25X5/8 SAFETYGLIDE (NEEDLE) IMPLANT
NS IRRIG 1000ML POUR BTL (IV SOLUTION) ×3 IMPLANT
PACK C SECTION WH (CUSTOM PROCEDURE TRAY) ×3 IMPLANT
PAD ABD 7.5X8 STRL (GAUZE/BANDAGES/DRESSINGS) ×3 IMPLANT
PAD OB MATERNITY 4.3X12.25 (PERSONAL CARE ITEMS) ×3 IMPLANT
PENCIL SMOKE EVAC W/HOLSTER (ELECTROSURGICAL) ×3 IMPLANT
RTRCTR C-SECT PINK 25CM LRG (MISCELLANEOUS) ×3 IMPLANT
STRIP CLOSURE SKIN 1/2X4 (GAUZE/BANDAGES/DRESSINGS) ×2 IMPLANT
SUT VIC AB 0 CTX 36 (SUTURE) ×8
SUT VIC AB 0 CTX36XBRD ANBCTRL (SUTURE) ×4 IMPLANT
SUT VIC AB 2-0 CT1 27 (SUTURE) ×2
SUT VIC AB 2-0 CT1 TAPERPNT 27 (SUTURE) ×1 IMPLANT
SUT VIC AB 4-0 KS 27 (SUTURE) ×3 IMPLANT
TOWEL OR 17X24 6PK STRL BLUE (TOWEL DISPOSABLE) ×3 IMPLANT
TRAY FOLEY BAG SILVER LF 14FR (SET/KITS/TRAYS/PACK) ×3 IMPLANT

## 2016-08-11 NOTE — Anesthesia Procedure Notes (Signed)
Epidural Patient location during procedure: OR  Staffing Anesthesiologist: Cristela BlueJACKSON, Manda Holstad  Preanesthetic Checklist Completed: patient identified, site marked, surgical consent, pre-op evaluation, timeout performed, IV checked, risks and benefits discussed and monitors and equipment checked  Epidural Patient position: sitting Prep: site prepped and draped and DuraPrep Patient monitoring: continuous pulse ox and blood pressure Approach: midline Location: L2-L3 Injection technique: LOR air  Needle:  Needle type: Tuohy  Needle gauge: 17 G Needle length: 9 cm and 9 Needle insertion depth: 6 cm Catheter type: closed end flexible Catheter size: 19 Gauge Catheter at skin depth: 12 cm Test dose: negative  Assessment Events: blood not aspirated, injection not painful, no injection resistance, negative IV test and no paresthesia  Additional Notes Scoliosis to the left Easy LOR sprotte thru tuohy No paresthesia Spinal Dosage in OR  .75% Bupivicaine ml       1.4     PFMS04   mcg        100    Fentanyl mcg            25 Catheter (-) asp Heme/CSF

## 2016-08-11 NOTE — Lactation Note (Signed)
This note was copied from a baby's chart. Lactation Consultation Note P4 mom with infant STS.  BF 3 years with first child 4 years with second, currently nursing the 27 year old.  Mom states infant was clicking earlier during feeds and concerned with lip tie.   Mom demonstrated hand expression but infant was too sleepy to latch.  LC encouraged mom to call out in order to observe the next feed in order to observe the clicking noise heard.  LC had infant suck her gloved finger and strong tugs were felt.  The mobility of the upper lip did not seem limited at this time when stretched with finger.  With a second attempt, baby did latch and with compression swallows were heard.  Infant top lip was able to easily be untucked and mom denies pinching.  Baby nursed well and no clicking was heard.  [facility] in closer and blanket was provided to give more support to infant.  Mom very receptive to teaching.  Lactation brochure and services reviewed.  BFSG information given as well as resource sheet.  Baby feeding well after LC left room.  At that point baby had been at breast 5 minutes.  Pt. To call out for further questions, concerns, or assistance.    Patient Name: Melinda Higgins ZOXWR'UToday's Date: 08/11/2016 Reason for consult: Initial assessment   Maternal Data Formula Feeding for Exclusion: No Has patient been taught Hand Expression?: Yes Does the patient have breastfeeding experience prior to this delivery?: Yes  Feeding Feeding Type: Breast Fed Length of feed:  (continued nursing after leaving room-first 5 mintues at breast)  LATCH Score/Interventions Latch: Grasps breast easily, tongue down, lips flanged, rhythmical sucking.  Audible Swallowing: A few with stimulation Intervention(s): Skin to skin;Hand expression  Type of Nipple: Everted at rest and after stimulation  Comfort (Breast/Nipple): Soft / non-tender     Hold (Positioning): Assistance needed to correctly  position infant at breast and maintain latch. (Encouraged mom to pull infant closer) Intervention(s): Breastfeeding basics reviewed;Support Pillows;Position options  LATCH Score: 8  Lactation Tools Discussed/Used WIC Program: No   Consult Status Consult Status: Follow-up Date: 08/12/16 Follow-up type: In-patient    [name] 08/11/2016, 4:47 PM

## 2016-08-11 NOTE — Anesthesia Postprocedure Evaluation (Signed)
Anesthesia Post Note  Patient: Melinda Higgins  Procedure(s) Performed: Procedure(s) (LRB): CESAREAN SECTION (N/A)  Patient location during evaluation: Mother Baby Anesthesia Type: Spinal Level of consciousness: awake, awake and alert, oriented and patient cooperative Pain management: pain level controlled Vital Signs Assessment: post-procedure vital signs reviewed and stable Respiratory status: spontaneous breathing Cardiovascular status: stable Postop Assessment: no headache, no backache, no signs of nausea or vomiting, spinal receding, patient able to bend at knees and adequate PO intake Anesthetic complications: no        Last Vitals:  Vitals:   08/11/16 1545 08/11/16 1645  BP: 115/63 108/61  Pulse: (!) 56 (!) 58  Resp: 18 17  Temp: 36.7 C 36.8 C    Last Pain:  Vitals:   08/11/16 1645  TempSrc: Oral  PainSc: 2    Pain Goal: Patients Stated Pain Goal: 3 (08/11/16 1645)               [name]

## 2016-08-11 NOTE — Addendum Note (Signed)
Addendum  created 08/11/16 1753 by Jennye Boroughsollins, Shanara Schnieders J, CRNA   Sign clinical note

## 2016-08-11 NOTE — Transfer of Care (Signed)
Immediate Anesthesia Transfer of Care Note  Patient: Melinda Higgins  Procedure(s) Performed: Procedure(s): CESAREAN SECTION (N/A)  Patient Location: PACU  Anesthesia Type:Spinal  Level of Consciousness: awake, alert  and oriented  Airway & Oxygen Therapy: Patient Spontanous Breathing  Post-op Assessment: Report given to RN and Post -op Vital signs reviewed and stable  Post vital signs: Reviewed and stable  Last Vitals: There were no vitals filed for this visit.  Last Pain: There were no vitals filed for this visit.    Patients Stated Pain Goal: 4 (08/11/16 1017)  Complications: No apparent anesthesia complications

## 2016-08-11 NOTE — Anesthesia Postprocedure Evaluation (Signed)
Anesthesia Post Note  Patient: Melinda Higgins  Procedure(s) Performed: Procedure(s) (LRB): CESAREAN SECTION (N/A)  Patient location during evaluation: PACU Anesthesia Type: Spinal Level of consciousness: awake Pain management: satisfactory to patient Vital Signs Assessment: post-procedure vital signs reviewed and stable Respiratory status: spontaneous breathing Cardiovascular status: blood pressure returned to baseline Postop Assessment: no headache and spinal receding Anesthetic complications: no        Last Vitals:  Vitals:   08/11/16 1358 08/11/16 1359  BP:    Pulse: (!) 50 (!) 52  Resp: 17 17    Last Pain:  Vitals:   08/11/16 1320  PainSc: 0-No pain   Pain Goal: Patients Stated Pain Goal: 4 (08/11/16 1017)               Talon Regala EDWARD

## 2016-08-11 NOTE — Op Note (Signed)
Melinda Higgins PROCEDURE DATE: 08/11/2016  PREOPERATIVE DIAGNOSIS: Intrauterine pregnancy at  [redacted]w[redacted]d weeks gestation; previous cesarean section, history of DVT following surgery  POSTOPERATIVE DIAGNOSIS: The same  PROCEDURE: Repeat Low Transverse Cesarean Section  SURGEON:  Dr. Candelaria Celeste  ASSISTANT: Dr Shonna Chock, Dr Willodean Rosenthal  INDICATIONS: Melinda Higgins is a 26 y.o. [id number] at [redacted]w[redacted]d scheduled for cesarean section secondary to elective repeat.  The risks of cesarean section discussed with the patient included but were not limited to: bleeding which may require transfusion or reoperation; infection which may require antibiotics; injury to bowel, bladder, ureters or other surrounding organs; injury to the fetus; need for additional procedures including hysterectomy in the event of a life-threatening hemorrhage; placental abnormalities wth subsequent pregnancies, incisional problems, thromboembolic phenomenon and other postoperative/anesthesia complications. The patient concurred with the proposed plan, giving informed written consent for the procedure.    FINDINGS:  Viable female infant in vertex presentation.  Apgars 9 and 9, weight pending.  Clear amniotic fluid.  Intact placenta, three vessel cord.  Normal uterus, fallopian tubes and ovaries bilaterally.  ANESTHESIA:    Spinal INTRAVENOUS FLUIDS:2700 ml ESTIMATED BLOOD LOSS: 600 ml URINE OUTPUT:  150 ml SPECIMENS: Placenta: patient requested placenta COMPLICATIONS: None immediate  PROCEDURE IN DETAIL:  The patient received intravenous antibiotics and had sequential compression devices applied to her lower extremities while in the preoperative area.  She was then taken to the operating room where spinal anesthesia was administered and epidural anesthesia was dosed up to surgical level and was found to be adequate. She was then placed in a dorsal supine position with a leftward tilt, and prepped and draped in a sterile manner.   A foley catheter was placed into her bladder and attached to constant gravity, which drained clear fluid throughout.  After an adequate timeout was performed, a Pfannenstiel skin incision was made with scalpel and carried through to the underlying layer of fascia. The fascia was incised in the midline and this incision was extended bilaterally using the Mayo scissors. Kocher clamps were applied to the superior aspect of the fascial incision and the underlying rectus muscles were dissected off bluntly. A similar process was carried out on the inferior aspect of the facial incision. The rectus muscles were separated in the midline bluntly and the peritoneum was entered bluntly. A bladder blade was placed to retract the bladder. Attention was turned to the lower uterine segment where the vesicoperitoneum was elevated and incised with Metzenbaum scissors and extended bilaterally to form a bladder flap. The bladder flap was tented up with pickups and bluntly dissected away from the uterus bluntly. A transverse hysterotomy was then made with a scalpel and extended bilaterally bluntly. The infant was successfully delivered, and cord was clamped and cut and infant was handed over to awaiting neonatology team. Uterine massage was then administered and the placenta delivered intact with three-vessel cord. The uterus was then cleared of clot and debris.  The hysterotomy was closed with 0 Vicryl in a running locked fashion, and an imbricating layer was also placed with a 0 Vicryl. Overall, excellent hemostasis was noted. The abdomen and the pelvis were cleared of all clot and debris and the [name] was removed. Hemostasis was confirmed on all surfaces. The peritoneum was reapproximated using 2-0 vicryl running stitches. The catheter for an On-Q pump was primed and inserted into the skin and fascia approximately 5cm from the skin incision on the right corner.  This was laid across the rectus muscles.  The fascia was then closed  using 0 Vicryl in a running fashion. The On-Q catheter was retracted 1 cm to ensure that it was not entrapped. The skin was closed with 4-0 vicryl. The patient tolerated the procedure well. Sponge, lap, instrument and needle counts were correct x 2. She was taken to the recovery room in stable condition.    [name], [name], DO 08/11/2016 1:10 PM

## 2016-08-11 NOTE — Anesthesia Preprocedure Evaluation (Signed)
Anesthesia Evaluation  Patient identified by MRN, date of birth, ID band Patient awake    Reviewed: Allergy & Precautions, H&P , Patient's Chart, lab work & pertinent test results  Airway Mallampati: II  TM Distance: >3 FB Neck ROM: full    Dental no notable dental hx.    Pulmonary    Pulmonary exam normal breath sounds clear to auscultation       Cardiovascular Exercise Tolerance: Good  Rhythm:regular Rate:Normal     Neuro/Psych    GI/Hepatic   Endo/Other    Renal/GU      Musculoskeletal   Abdominal   Peds  Hematology  (+) anemia ,   Anesthesia Other Findings   Reproductive/Obstetrics                             Anesthesia Physical Anesthesia Plan  ASA: II  Anesthesia Plan: Spinal   Post-op Pain Management:    Induction:   Airway Management Planned:   Additional Equipment:   Intra-op Plan:   Post-operative Plan:   Informed Consent: I have reviewed the patients History and Physical, chart, labs and discussed the procedure including the risks, benefits and alternatives for the proposed anesthesia with the patient or authorized representative who has indicated his/her understanding and acceptance.     Plan Discussed with:   Anesthesia Plan Comments: (  )        Anesthesia Quick Evaluation

## 2016-08-11 NOTE — H&P (Signed)
Faculty Practice H&P  Pollyann GlenShannon K Petronio is a 26 y.o. female [phone] with IUP at 4058w0d presenting for elective cesarean section for prior cesarean x3. Pregnancy was been complicated by DVT with prior surgery, on lovenox, fetal renal anomaly (right renal pelvis measuring 8-579mm.    Pt states she has been having no contractions, no vaginal bleeding, intact membranes, with normal fetal movement.     [place] 8[place] Pregnancy complications or risks: Patient Active Problem List   Diagnosis Date Noted  . Fetal renal anomaly, single gestation 05/01/2016  . Pregnancy complicated by fetal cerebral ventriculomegaly, single gestation 05/01/2016  . Dizziness 04/25/2016  . Family history of congenital anomaly of genitourinary system 04/03/2016  . 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 Cesarean section wound complication 01/08/2016  . Supervision of high risk pregnancy, antepartum 01/06/2016  . Nausea and vomiting of pregnancy, antepartum 01/06/2016  . Previous cesarean delivery affecting pregnancy, antepartum 01/06/2016  . History of spinal fusion 04/19/2013  . Spondylolisthesis 04/19/2013  . Asthma 04/19/2013  . Scoliosis 04/19/2013  . Postoperative deep vein thrombosis (DVT) (HCC) 03/24/2003   She desires to vasectomy.  She plans to plans to breastfeed  Prenatal labs and studies: ABO, Rh: --/--/AB POS (05/21 1100) Antibody: NEG (05/21 1100) Rubella: !Error! RPR: Non Reactive (05/21 1100)  HBsAg: NEGATIVE (10/16 1116)  HIV: REACTIVE (10/16 1116)  GBS:    1 hr Glucola normal Genetic screeningnormal Anatomy US abnormal with right renal pelvis dilation  Past Medical History:  Past Medical History:  Diagnosis Date  . Anemia   . Asthma   . Family history of congenital anomaly of genitourinary system 04/03/2016   Previous child w/ pyelectasis requiring surgery. Husband w/ Hx Pyelectasis. MFM recommends F/U US at 28 and 36 weeks to  eval for pyelectasis.  Marland Kitchen. Postoperative deep vein thrombosis (DVT) (HCC) 2005   after spinal surgery    Past Surgical History:  Past Surgical History:  Procedure Laterality Date  . BACK SURGERY     rods put in back  . CESAREAN SECTION     3 times  . EYE SURGERY      Obstetrical History:  OB History    Gravida Para Term Preterm AB Living   5 3 3   1 3    SAB TAB Ectopic Multiple Live Births   1       3      Gynecological History:  OB History    Gravida Para Term Preterm AB Living   5 3 3   1 3    SAB TAB Ectopic Multiple Live Births   1       3      Social History:  Social History   Social History  . Marital status: Married    Spouse name: N/A  . Number of children: N/A  . Years of education: N/A   Occupational History  . stay at home    Social History Main Topics  . Smoking status: Never Smoker  . Smokeless tobacco: Never Used  . Alcohol use No  . Drug use: No  . Sexual activity: Yes    Birth control/ protection: None   Other Topics Concern  . None   Social History Narrative  . None    Family History:  Family History  Problem Relation Age of Onset  . Diabetes Unknown   . Heart disease Unknown   . Diabetes Mother   . Heart disease  Mother     Medications:  Prenatal vitamins,  Current Facility-Administered Medications  Medication Dose Route Frequency Provider Last Rate Last Dose  . ceFAZolin (ANCEF) IVPB 2g/100 mL premix  2 g Intravenous On Call to OR Levie Heritage, DO      . lactated ringers infusion   Intravenous Continuous Cristela Blue, MD      . lactated ringers infusion  125 mL/hr Intravenous Continuous Levie Heritage, DO      . scopolamine (TRANSDERM-SCOP) 1 MG/3DAYS 1.5 mg  1 patch Transdermal Once PRN Cristela Blue, MD   1.5 mg at 08/11/16 1050    Allergies:  Allergies  Allergen Reactions  . Allergy Relief  [Chlorpheniramine Maleate] Swelling    Patient states generalized swelling when taking benadryl.  Reported on 08/29/2013  1133  . Cabbage Swelling    Throat swelling  . Soy Allergy Swelling  . Benadryl [Diphenhydramine Hcl] Hives and Other (See Comments)    Makes pt numb all over  . Lactose Intolerance (Gi) Swelling    ALLERGIC TO DAIRY (LIPS/THROAT/FACIAL SWELLING)  . Macadamia Nut Oil   . Morphine And Related Itching    ITCHING EVERYWHERE  . Peanut-Containing Drug Products Swelling    Review of Systems: - negative  Physical Exam: Height 5\' 5"  (1.651 m), weight 157 lb (71.2 kg), last menstrual period 11/12/2015, unknown if currently breastfeeding. GENERAL: Well-developed, well-nourished female in no acute distress.  LUNGS: Clear to auscultation bilaterally.  HEART: Regular rate and rhythm. ABDOMEN: Soft, nontender, nondistended, gravid. EFW 7.5 lbs EXTREMITIES: Nontender, no edema, 2+ distal pulses. Cervical Exam: Dilatation 1cm    FHT:  Baseline rate bpm   Pertinent Labs/Studies:   Lab Results  Component Value Date   WBC 9.1 08/10/2016   HGB 9.9 (L) 08/10/2016   HCT 30.5 (L) 08/10/2016   MCV 79.2 08/10/2016   PLT 137 (L) 08/10/2016     Assessment : RYLLIE NIELAND is a 26 y.o. [id number] at [redacted]w[redacted]d being admitted for cesarean section secondary to prior cesarean  Plan: The risks of cesarean section discussed with the patient included but were not limited to: bleeding which may require transfusion or reoperation; infection which may require antibiotics; injury to bowel, bladder, ureters or other surrounding organs; injury to the fetus; need for additional procedures including hysterectomy in the event of a life-threatening hemorrhage; placental abnormalities wth subsequent pregnancies, incisional problems, thromboembolic phenomenon and other postoperative/anesthesia complications. The patient concurred with the proposed plan, giving informed written consent for the procedure.   Patient has been NPO since last night and will remain NPO for procedure.  Last dose of lovenox on Sunday evening.  Preoperative prophylactic Ancef ordered on call to the OR.    Levie Heritage, DO 08/11/2016, 10:58 AM

## 2016-08-12 LAB — CBC
HEMATOCRIT: 24.3 % — AB (ref 36.0–46.0)
Hemoglobin: 8 g/dL — ABNORMAL LOW (ref 12.0–15.0)
MCH: 25.7 pg — ABNORMAL LOW (ref 26.0–34.0)
MCHC: 32.9 g/dL (ref 30.0–36.0)
MCV: 78.1 fL (ref 78.0–100.0)
PLATELETS: 114 10*3/uL — AB (ref 150–400)
RBC: 3.11 MIL/uL — ABNORMAL LOW (ref 3.87–5.11)
RDW: 16.9 % — AB (ref 11.5–15.5)
WBC: 9.4 10*3/uL (ref 4.0–10.5)

## 2016-08-12 MED ORDER — PROMETHAZINE HCL 25 MG PO TABS
25.0000 mg | ORAL_TABLET | Freq: Four times a day (QID) | ORAL | Status: DC | PRN
  Administered 2016-08-12: 25 mg via ORAL
  Filled 2016-08-12: qty 1

## 2016-08-12 MED FILL — Bupivacaine HCl Inj 0.5%: Qty: 300 | Status: AC

## 2016-08-12 NOTE — Plan of Care (Signed)
Problem: Urinary Elimination: Goal: Ability to reestablish a normal urinary elimination pattern will improve Outcome: Progressing Upon urinating for the first time after having the catheter removed, pt informed me that she "tends to get bladder infections from them (catheters)" and "It was kind of painful and there was a lot of starting and stopping (with urination)." Cranberry juice provided.

## 2016-08-12 NOTE — Progress Notes (Signed)
I have seen this patient and agree with the plan. 

## 2016-08-12 NOTE — Progress Notes (Signed)
Post Partum Day #1  Subjective:  Melinda Higgins is a 26 y.o. [id number] 5527w0d s/p elective repeat LTCS.  No acute events overnight.  Pt denies problems with ambulating, voiding or po intake.  She denies nausea or vomiting.  Pain is well controlled.  She has had flatus. She has had bowel movement.  Lochia Moderate.  Plan for birth control is vasectomy.  Method of Feeding: Breast  Objective: BP (!) 106/51 (BP Location: Right Arm)   Pulse 63   Temp 97.7 F (36.5 C) (Axillary)   Resp 18   Ht 5\' 5"  (1.651 m)   Wt 157 lb (71.2 kg)   LMP 11/12/2015   SpO2 98%   Breastfeeding? Unknown   BMI 26.13 kg/m   Physical Exam:  General: alert, cooperative and no distress Lochia:normal flow Chest: CTAB Heart: RRR no m/r/g Abdomen: +BS, soft, nontender, fundus firm at/below umbilicus DVT Evaluation: No evidence of DVT seen on physical exam. Extremities: No edema   Recent Labs  08/11/16 1005 08/12/16 0621  HGB 10.4* 8.0*  HCT 31.3* 24.3*    Assessment/Plan:  ASSESSMENT: Melinda Higgins is a 26 y.o. [id number] 7027w0d ppd #1 s/p elective repeat LTCS doing well.   Plan for discharge tomorrow   LOS: 1 day   Tarri AbernethyAbigail J Layten Aiken, MD 08/12/2016, 7:45 AM

## 2016-08-12 NOTE — Plan of Care (Addendum)
Problem: Skin Integrity: Goal: Demonstration of wound healing without infection will improve Outcome: Progressing I have been monitoring On-Q pump throughout shift. Site appears to have fresh blood, but it does not change in location or bleed through dressings. Pt does not complain of pain at the site. Will continue to monitor.   EDIT: Pt reported site was oozing at 0600 check, contacted on-call faculty practice resident Dr Natale MilchLancaster who ordered a pressure dressing.

## 2016-08-12 NOTE — Lactation Note (Signed)
This note was copied from a baby's chart. Lactation Consultation Note  Patient Name: [name] [id number]'s Date: 08/12/2016  Mom states baby is latching easily and nursing well.  No questions/concerns at this time.  Encouraged to call with concerns/assist prn.   Maternal Data    Feeding    LATCH Score/Interventions                      Lactation Tools Discussed/Used     Consult Status      Huston FoleyMOULDEN, Melinda Higgins S 08/12/2016, 1:31 PM

## 2016-08-13 LAB — CBC
HCT: 25 % — ABNORMAL LOW (ref 36.0–46.0)
HEMOGLOBIN: 8.1 g/dL — AB (ref 12.0–15.0)
MCH: 26 pg (ref 26.0–34.0)
MCHC: 32.4 g/dL (ref 30.0–36.0)
MCV: 80.1 fL (ref 78.0–100.0)
Platelets: 140 10*3/uL — ABNORMAL LOW (ref 150–400)
RBC: 3.12 MIL/uL — ABNORMAL LOW (ref 3.87–5.11)
RDW: 17.1 % — AB (ref 11.5–15.5)
WBC: 8.9 10*3/uL (ref 4.0–10.5)

## 2016-08-13 MED ORDER — IBUPROFEN 600 MG PO TABS: 600.0000 mg | ORAL_TABLET | Freq: Four times a day (QID) | ORAL | 0 refills | Status: DC

## 2016-08-13 MED ORDER — OXYCODONE HCL 5 MG PO TABS
5.0000 mg | ORAL_TABLET | ORAL | 0 refills | Status: DC | PRN
Start: 2016-08-13 — End: 2016-09-17

## 2016-08-13 NOTE — Discharge Instructions (Signed)

## 2016-08-13 NOTE — Discharge Summary (Signed)
OB Discharge Summary  Patient Name: Melinda Higgins DOB: 10/07/1990 MRN: [id number]  Date of admission: 08/11/2016 Delivering MD: [name], JACOB J   Date of discharge: 08/13/2016  Admitting diagnosis: RCS Intrauterine pregnancy: 1748w0d     Secondary diagnosis:Active Problems:   Status post repeat low transverse cesarean section      Discharge diagnosis: Term Pregnancy Delivered         Complications: None  Hospital course:  Sceduled C/S   26 y.o. yo [id number] at 2448w0d was admitted to the hospital 08/11/2016 for scheduled cesarean section with the following indication:Elective Repeat.  Membrane Rupture Time/Date: 12:19 PM ,08/11/2016   Patient delivered a Viable infant.08/11/2016  Details of operation can be found in separate operative note.  Pateint had an uncomplicated postpartum course.  She is ambulating, tolerating a regular diet, passing flatus, and urinating well. Patient is discharged home in stable condition on  08/13/16         Physical exam  Vitals:   08/11/16 2200 08/12/16 1100 08/12/16 1847 08/13/16 0612  BP: (!) 106/51 110/62 132/71 120/69  Pulse: 63 64 73 60  Resp: 18 16 18 18   Temp: 97.7 F (36.5 C) 99.3 F (37.4 C) 97.6 F (36.4 C) 97.7 F (36.5 C)  TempSrc: Axillary Oral Oral Axillary  SpO2: 98%  100%   Weight:      Height:       General: alert Lochia: appropriate Uterine Fundus: firm Incision: Healing well with no significant drainage DVT Evaluation: No evidence of DVT seen on physical exam. Labs: Lab Results  Component Value Date   WBC 8.9 08/13/2016   HGB 8.1 (L) 08/13/2016   HCT 25.0 (L) 08/13/2016   MCV 80.1 08/13/2016   PLT 140 (L) 08/13/2016   CMP Latest Ref Rng & Units 04/10/2016  Glucose 65 - 99 mg/dL 79  BUN 7 - 25 mg/dL 10  Creatinine 9.320.50 - 3.551.10 mg/dL 7.32(K0.49(L)  Sodium 025135 - 427146 mmol/L 136  Potassium 3.5 - 5.3 mmol/L 4.3  Chloride 98 - 110 mmol/L 104  CO2 20 - 31 mmol/L 21  Calcium 8.6 - 10.2 mg/dL 8.6  Total Protein 6.1 - 8.1  g/dL 6.5  Total Bilirubin 0.2 - 1.2 mg/dL 0.9  Alkaline Phos 33 - 115 U/L 49  AST 10 - 30 U/L 11  ALT 6 - 29 U/L 9    Discharge instruction: per After Visit Summary and "Baby and Me Booklet".  After Visit Meds:  Allergies as of 08/13/2016      Reactions   Allergy Relief  [chlorpheniramine Maleate] Swelling   Patient states generalized swelling when taking benadryl.  Reported on 08/29/2013 1133   Cabbage Swelling   Throat swelling   Soy Allergy Swelling   Benadryl [diphenhydramine Hcl] Hives, Other (See Comments)   Makes pt numb all over   Lactose Intolerance (gi) Swelling   ALLERGIC TO DAIRY (LIPS/THROAT/FACIAL SWELLING)   Macadamia Nut Oil    Morphine And Related Itching   ITCHING EVERYWHERE   Peanut-containing Drug Products Swelling   Zofran [ondansetron Hcl] Other (See Comments)   Passes out      Medication List    TAKE these medications   albuterol 108 (90 Base) MCG/ACT inhaler Commonly known as:  PROVENTIL HFA;VENTOLIN HFA Inhale 2 puffs into the lungs every 6 (six) hours as needed for wheezing or shortness of breath.   Butalbital-APAP-Caffeine 50-325-40 MG capsule Take 1-2 capsules by mouth every 6 (six) hours as needed for  headache.   cyclobenzaprine 5 MG tablet Commonly known as:  FLEXERIL Take 5 mg by mouth 3 (three) times daily as needed. For muscle spasms   enoxaparin 30 MG/0.3ML injection Commonly known as:  LOVENOX Inject 0.3 mLs (30 mg total) into the skin daily. What changed:  when to take this   Fluticasone-Salmeterol 250-50 MCG/DOSE Aepb Commonly known as:  ADVAIR DISKUS Inhale 1 puff into the lungs 2 (two) times daily.   FUSION PLUS Caps Take 1 capsule by mouth daily.   ibuprofen 600 MG tablet Commonly known as:  ADVIL,MOTRIN Take 1 tablet (600 mg total) by mouth every 6 (six) hours.   montelukast 10 MG tablet Commonly known as:  SINGULAIR Take 1 tablet (10 mg total) by mouth at bedtime. What changed:  when to take this   OVER THE COUNTER  MEDICATION Take 1 tablet by mouth daily. BLOOD BUILDER BY [employer] (VITAMIN C 15 MG/FOLATE 400 MCG/VITAMIN B-12 30 MCG/IRON 26 MG/BEET ROOT 125 MG)   oxyCODONE 5 MG immediate release tablet Commonly known as:  Oxy IR/ROXICODONE Take 1 tablet (5 mg total) by mouth every 4 (four) hours as needed (pain scale 4-7).   prenatal multivitamin Tabs tablet Take 1 tablet by mouth daily.   promethazine 25 MG tablet Commonly known as:  PHENERGAN Take 1 tablet (25 mg total) by mouth every 6 (six) hours as needed for nausea or vomiting.   sertraline 50 MG tablet Commonly known as:  ZOLOFT Take 1 tablet (50 mg total) by mouth daily.       Diet: routine diet  Activity: Advance as tolerated. Pelvic rest for 6 weeks.   Outpatient follow up:4 weeks Follow up Appt:Future Appointments Date Time Provider Department Center  08/25/2016 9:00 AM Thresa Ross, MD [facility] None   Follow up visit: No Follow-up on file.  Postpartum contraception: Natural Family Planning  Newborn Data: Live born female  Birth Weight: 7 lb 9 oz (3430 g) APGAR: 9, 9  Baby Feeding: Breast Disposition:home with mother   08/13/2016 Allie Bossier, MD

## 2016-08-13 NOTE — Lactation Note (Signed)
This note was copied from a baby's chart. Lactation Consultation Note  Patient Name: Melinda Lenor DerrickShannon Kirt WUJWJ'XToday's Date: 08/13/2016   Day of discharge, baby 4647 hrs old.  Mom denies any difficulty with latching and breastfeeding.  Mom states her milk volume is increasing.  Talked about importance of keeping baby STS, and feeding often when he cues.  Mom choosing to pump as well as breastfeeding.  Encouraged her to pump if full and uncomfortable.  Reminded Mom of OP lactation services available.     Judee ClaraSmith, Kimothy Kishimoto E 08/13/2016, 11:22 AM

## 2016-08-25 ENCOUNTER — Ambulatory Visit (HOSPITAL_COMMUNITY): Payer: Self-pay | Admitting: Psychiatry

## 2016-09-17 ENCOUNTER — Ambulatory Visit (INDEPENDENT_AMBULATORY_CARE_PROVIDER_SITE_OTHER): Payer: Medicaid Other | Admitting: Family Medicine

## 2016-09-17 ENCOUNTER — Encounter: Payer: Self-pay | Admitting: Family Medicine

## 2016-09-17 DIAGNOSIS — T8189XS Other complications of procedures, not elsewhere classified, sequela: Secondary | ICD-10-CM

## 2016-09-17 DIAGNOSIS — I82409 Acute embolism and thrombosis of unspecified deep veins of unspecified lower extremity: Secondary | ICD-10-CM

## 2016-09-17 NOTE — Progress Notes (Signed)
Post Partum Exam  Melinda Higgins is a 26 y.o. [id number] female who presents for a postpartum visit. She is 5 weeks postpartum following a low cervical transverse Cesarean section. I have fully reviewed the prenatal and intrapartum course. The delivery was at 39 gestational weeks.  Anesthesia: spinal. Postpartum course has beenuneventful. Baby's course has had some colic. Baby is feeding by breast. Bleeding no bleeding. Bowel function is normal. Bladder function is normal. Patient is not sexually active. Contraception method is vasectomy. Postpartum depression screening:neg  The following portions of the patient's history were reviewed and updated as appropriate: allergies, current medications, past family history, past medical history, past social history, past surgical history and problem list.  Review of Systems Pertinent items are noted in HPI.    Objective:  Blood pressure 132/89, pulse (!) 158, weight 138 lb (62.6 kg), currently breastfeeding.  General:  alert, cooperative and no distress  Lungs: clear to auscultation bilaterally  Heart:  regular rate and rhythm, S1, S2 normal, no murmur, click, rub or gallop  Abdomen: soft, mild tenderness over lower abdomen. Uterus not fully involuted, but firm. Incision clean, dry, intact.        Assessment:    normal postpartum exam. Pap smear not done at today's visit.   Plan:   1. Contraception: vasectomy 2. Continue lovenox for 2 more weeks, then stop. 3. Follow up in: 1 year or as needed.

## 2016-10-02 NOTE — Addendum Note (Signed)
Addendum  created 10/02/16 1307 by Whitni Pasquini, MD   Sign clinical note    

## 2016-10-02 NOTE — Anesthesia Postprocedure Evaluation (Signed)
Anesthesia Post Note  Patient: Melinda Higgins  Procedure(s) Performed: Procedure(s) (LRB): CESAREAN SECTION (N/A)     Anesthesia Post Evaluation  Last Vitals:  Vitals:   08/12/16 1847 08/13/16 0612  BP: 132/71 120/69  Pulse: 73 60  Resp: 18 18  Temp: 36.4 C 36.5 C    Last Pain:  Vitals:   08/13/16 0815  TempSrc:   PainSc: 0-No pain                 Kamrynn Melott EDWARD

## 2018-03-04 ENCOUNTER — Inpatient Hospital Stay (HOSPITAL_COMMUNITY)
Admission: AD | Admit: 2018-03-04 | Discharge: 2018-03-04 | Disposition: A | Discharge status: Home / Self Care | Payer: Medicaid Other | Source: Ambulatory Visit | Attending: Obstetrics & Gynecology | Admitting: Obstetrics & Gynecology

## 2018-03-04 ENCOUNTER — Encounter (HOSPITAL_COMMUNITY): Payer: Self-pay | Admitting: *Deleted

## 2018-03-04 DIAGNOSIS — Z3202 Encounter for pregnancy test, result negative: Secondary | ICD-10-CM | POA: Insufficient documentation

## 2018-03-04 DIAGNOSIS — D649 Anemia, unspecified: Secondary | ICD-10-CM | POA: Diagnosis present

## 2018-03-04 DIAGNOSIS — N926 Irregular menstruation, unspecified: Secondary | ICD-10-CM | POA: Diagnosis not present

## 2018-03-04 LAB — CBC WITH DIFFERENTIAL/PLATELET
BASOS ABS: 0 10*3/uL (ref 0.0–0.1)
Basophils Relative: 0 %
EOS ABS: 0.3 10*3/uL (ref 0.0–0.5)
EOS PCT: 4 %
HCT: 39.5 % (ref 36.0–46.0)
Hemoglobin: 13.2 g/dL (ref 12.0–15.0)
LYMPHS ABS: 2.3 10*3/uL (ref 0.7–4.0)
Lymphocytes Relative: 26 %
MCH: 29.3 pg (ref 26.0–34.0)
MCHC: 33.4 g/dL (ref 30.0–36.0)
MCV: 87.8 fL (ref 80.0–100.0)
MONOS PCT: 5 %
Monocytes Absolute: 0.4 10*3/uL (ref 0.1–1.0)
NRBC: 0 % (ref 0.0–0.2)
Neutro Abs: 6 10*3/uL (ref 1.7–7.7)
Neutrophils Relative %: 65 %
PLATELETS: 238 10*3/uL (ref 150–400)
RBC: 4.5 MIL/uL (ref 3.87–5.11)
RDW: 12.6 % (ref 11.5–15.5)
WBC: 9.1 10*3/uL (ref 4.0–10.5)

## 2018-03-04 LAB — URINALYSIS, ROUTINE W REFLEX MICROSCOPIC
BILIRUBIN URINE: NEGATIVE
Glucose, UA: NEGATIVE mg/dL
Hgb urine dipstick: NEGATIVE
KETONES UR: NEGATIVE mg/dL
Leukocytes, UA: NEGATIVE
NITRITE: NEGATIVE
Protein, ur: NEGATIVE mg/dL
Specific Gravity, Urine: 1.03 (ref 1.005–1.030)
pH: 5 (ref 5.0–8.0)

## 2018-03-04 LAB — POCT PREGNANCY, URINE: Preg Test, Ur: NEGATIVE

## 2018-03-04 NOTE — MAU Provider Note (Signed)
History     CSN: [id number]  Arrival date and time: 03/04/18 4098   First Provider Initiated Contact with Patient 03/04/18 2042      Chief Complaint  Patient presents with  . low iron   HPI  Melinda Higgins is a 27 y.o. [phone] non-pregnant patient who presents to MAU with concern for anemia in the setting of occasionally heavy periods and intermittent feelings of SOB and dizziness during the heaviest day of her period. Patient is breastfeeding her toddler and has multiple allergies. She has been hesitant to try OTC supplements due to possible allergens in the ingredients but started "Blood Builder" last week and has already noticed improvement. She denies fever, recent illness, pain. Upon arrival to MAU she states her SOB and dizziness resolved yesterday.  Patient Active Problem List   Diagnosis Date Noted  . Fetal renal anomaly, single gestation 05/01/2016  . Dizziness 04/25/2016  . Family history of congenital anomaly of genitourinary system 04/03/2016  . 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 History of spinal fusion 04/19/2013  . Spondylolisthesis 04/19/2013  . Asthma 04/19/2013  . Scoliosis 04/19/2013  . Postoperative deep vein thrombosis (DVT) (HCC) 03/24/2003     Pertinent Gynecological History: Menses: flow is moderate Bleeding: new onset spotting  Contraception: none DES exposure: denies Blood transfusions: none Sexually transmitted diseases: no past history and currently at risk Previous GYN Procedures: N/A   Past Medical History:  Diagnosis Date  . Anemia   . Asthma   . Family history of congenital anomaly of genitourinary system 04/03/2016   Previous child w/ pyelectasis requiring surgery. Husband w/ Hx Pyelectasis. MFM recommends F/U US at 28 and 36 weeks to eval for pyelectasis.  Marland Kitchen Postoperative deep vein thrombosis (DVT) (HCC) 2005   after spinal surgery    Past Surgical History:  Procedure Laterality Date  . BACK SURGERY     rods put in back   . CESAREAN SECTION     3 times  . CESAREAN SECTION N/A 08/11/2016   Procedure: CESAREAN SECTION;  Surgeon: Levie Heritage, DO;  Location: [facility] BIRTHING SUITES;  Service: Obstetrics;  Laterality: N/A;  . EYE SURGERY      Family History  Problem Relation Age of Onset  . Diabetes Other   . Heart disease Other   . Diabetes Mother   . Heart disease Mother     Social History   Tobacco Use  . Smoking status: Never Smoker  . Smokeless tobacco: Never Used  Substance Use Topics  . Alcohol use: No  . Drug use: No    Allergies:  Allergies  Allergen Reactions  . Allergy Relief  [Chlorpheniramine Maleate] Swelling    Patient states generalized swelling when taking benadryl.  Reported on 08/29/2013 1133  . Cabbage Swelling    Throat swelling  . Soy Allergy Swelling  . Benadryl [Diphenhydramine Hcl] Hives and Other (See Comments)    Makes pt numb all over  . Lactose Intolerance (Gi) Swelling    ALLERGIC TO DAIRY (LIPS/THROAT/FACIAL SWELLING)  . Macadamia Nut Oil   . Morphine And Related Itching    ITCHING EVERYWHERE  . Peanut-Containing Drug Products Swelling  . Zofran [Ondansetron Hcl] Other (See Comments)    Passes out    Medications Prior to Admission  Medication Sig Dispense Refill Last Dose  . albuterol (PROVENTIL HFA;VENTOLIN HFA) 108 (90 Base) MCG/ACT inhaler Inhale 2 puffs into the lungs every 6 (six) hours as needed for wheezing or shortness of breath.  Not Taking  . cyclobenzaprine (FLEXERIL) 5 MG tablet Take 5 mg by mouth 3 (three) times daily as needed. For muscle spasms  0 Not Taking  . enoxaparin (LOVENOX) 30 MG/0.3ML injection Inject 0.3 mLs (30 mg total) into the skin daily. (Patient taking differently: Inject 30 mg into the skin daily at 8 pm. ) 30 Syringe 6 Taking  . OVER THE COUNTER MEDICATION Take 1 tablet by mouth daily. BLOOD BUILDER BY [employer] (VITAMIN C 15 MG/FOLATE 400 MCG/VITAMIN B-12 30 MCG/IRON 26 MG/BEET ROOT 125 MG)   Not Taking  . Prenatal Vit-Fe  Fumarate-FA (PRENATAL MULTIVITAMIN) TABS tablet Take 1 tablet by mouth daily.    Taking    Review of Systems  Respiratory: Negative for apnea, cough, chest tightness, shortness of breath and wheezing.   Cardiovascular: Negative for chest pain.  Gastrointestinal: Negative for abdominal pain.  Genitourinary: Negative for vaginal bleeding.  Neurological: Negative for dizziness, syncope, weakness, numbness and headaches.  All other systems reviewed and are negative.  Physical Exam   Blood pressure 131/87, pulse 80, temperature (!) 97.5 F (36.4 C), resp. rate 18, height 5\' 5"  (1.651 m), weight 61.7 kg, last menstrual period 02/17/2018, SpO2 100 %, currently breastfeeding.  Physical Exam  Nursing note and vitals reviewed. Constitutional: She is oriented to person, place, and time. She appears well-developed and well-nourished.  Cardiovascular: Normal rate.  Respiratory: Effort normal. No respiratory distress.  Neurological: She is alert and oriented to person, place, and time.  Skin: Skin is warm and dry.  Psychiatric: She has a normal mood and affect. Her behavior is normal. Judgment and thought content normal.    MAU Course/MDM   Patient Vitals for the past 24 hrs:  BP Temp Pulse Resp SpO2 Height Weight  03/04/18 2001 131/87 (!) 97.5 F (36.4 C) 80 18 100 % 5\' 5"  (1.651 m) 61.7 kg    Results for orders placed or performed during the hospital encounter of 03/04/18 (from the past 24 hour(s))  CBC with Differential/Platelet     Status: None   Collection Time: 03/04/18  8:16 PM  Result Value Ref Range   WBC 9.1 4.0 - 10.5 K/uL   RBC 4.50 3.87 - 5.11 MIL/uL   Hemoglobin 13.2 12.0 - 15.0 g/dL   HCT 69.6 29.5 - 28.4 %   MCV 87.8 80.0 - 100.0 fL   MCH 29.3 26.0 - 34.0 pg   MCHC 33.4 30.0 - 36.0 g/dL   RDW 13.2 44.0 - 10.2 %   Platelets 238 150 - 400 K/uL   nRBC 0.0 0.0 - 0.2 %   Neutrophils Relative % 65 %   Neutro Abs 6.0 1.7 - 7.7 K/uL   Lymphocytes Relative 26 %   Lymphs Abs  2.3 0.7 - 4.0 K/uL   Monocytes Relative 5 %   Monocytes Absolute 0.4 0.1 - 1.0 K/uL   Eosinophils Relative 4 %   Eosinophils Absolute 0.3 0.0 - 0.5 K/uL   Basophils Relative 0 %   Basophils Absolute 0.0 0.0 - 0.1 K/uL  Urinalysis, Routine w reflex microscopic     Status: Abnormal   Collection Time: 03/04/18  8:29 PM  Result Value Ref Range   Color, Urine YELLOW YELLOW   APPearance HAZY (A) CLEAR   Specific Gravity, Urine 1.030 1.005 - 1.030   pH 5.0 5.0 - 8.0   Glucose, UA NEGATIVE NEGATIVE mg/dL   Hgb urine dipstick NEGATIVE NEGATIVE   Bilirubin Urine NEGATIVE NEGATIVE   Ketones, ur NEGATIVE NEGATIVE mg/dL  Protein, ur NEGATIVE NEGATIVE mg/dL   Nitrite NEGATIVE NEGATIVE   Leukocytes, UA NEGATIVE NEGATIVE  Pregnancy, urine POC     Status: None   Collection Time: 03/04/18  8:34 PM  Result Value Ref Range   Preg Test, Ur NEGATIVE NEGATIVE     Assessment and Plan  --27 y.o. [id number], negative pregnancy test --No signs of anemia, symptoms resolving per patient --Return to closest Urgent Care, ED or MAU for worsening symptoms --Discharge home in stable condition   Calvert CantorSamantha C Nakeem Murnane, CNM 03/04/2018, 9:43 PM

## 2018-03-04 NOTE — MAU Note (Signed)
Had a bad period couple wks ago and I am so tired. Think my iron is low. Having dizziness and SOB at times. Sometimes my sight looses focus like I may pass out. LMP 02/17/18. This is my third period since son born May 2018. No birth control. Having some abd pain on R side but I am ovulating

## 2018-03-04 NOTE — Discharge Instructions (Signed)
Eating Plan for Breastfeeding Women During breastfeeding, your body burns about 400-500 calories each day. It is important that you replace these burned calories by consuming a variety of healthy foods. There is no need to follow a special diet while breastfeeding your baby. Focus on making healthy choices to help support and maintain your milk production. To maintain a good milk supply and stay nourished yourself, it is also important that you drink plenty of water. What do I need to know about eating during breastfeeding? Medicines  Continue to take your prenatal vitamins and any other supplements as directed by your health care provider.  Talk with your health care provider about any medicines that you are taking. Certain medicines may slow or delay your milk production and may be harmful to your baby. Amount and Variety  Consume about 500 extra calories each day to maintain your milk supply.  Drink plenty of water, at least 64 oz per day or as directed by your health care provider. Try to drink at least 8 oz of water each time that you breastfeed. It is best to drink water before you feel thirsty. Your urine should be clear or pale yellow in color. If you notice that your urine is dark yellow, drink more water.  Continue to follow a well-balanced, healthy diet that includes healthy snacks. The taste of your milk will be affected by what you eat. Eating different foods will expose your baby to different tastes, which may help your baby to accept solid foods more easily later on. What to Limit or Avoid  Limit your overall intake of foods that have "empty calories." These are foods that have little nutritional value, such as sweets, desserts, candies, sugar-sweetened beverages, and fried foods.  Avoid drinking large amounts of caffeine or alcohol. ? Avoid drinking more than 2-3 cups (16-24 oz) of caffeinated drinks in a day. Caffeine is dehydrating. It will also enter your breast milk, and this  might bother your baby or interfere with his or her sleep. ? Avoid drinking more than one alcoholic drink per day, such as a 5-oz glass of wine, one 12-oz beer, or one standard cocktail. It may be best to wait to have your first alcoholic drink until your breastfeeding has been well established, which is usually after 2-3 months. After you have an alcoholic drink, wait at least 4 hours before you breastfeed. As an alternative, you may pump (express) your breast milk before you drink alcohol. Then, you can feed that milk to your baby at a later time.  Certain foods may cause you or your baby to have increased gas and may cause fussiness in your baby. If you notice increased gas or fussiness in your baby when you eat these foods, you may want to avoid them while breastfeeding. These may include: ? Chocolate. ? Spicy foods. ? Vegetables such as broccoli, cauliflower, cabbage, onions, or Brussels sprouts. Food Safety  To prevent foodborne illness, practice good food safety and cleanliness, such as washing your hands before you eat and after you prepare raw meat. What foods can I eat? Grains  All grains are okay to eat. Try to choose whole grains, such as whole wheat bread, oatmeal, or brown rice. Vegetables All vegetables are okay to eat. Try to eat a variety of colors and types of vegetables to get a full range of vitamins and minerals. Remember to wash your vegetables well before eating. Fruits All fruits are okay to eat. Try to eat a variety of   colors and types of fruit to get a full range of vitamins and minerals. Remember to wash your fruits well before eating. Meats and Other Protein Sources Lean meats are okay to eat. Try to eat chicken, turkey, fish, and lean cuts of beef, veal, or pork. If you choose fish, choose fish that are low in mercury, such as salmon, canned light tuna, and catfish. You can also eat seafood such as shrimp, crab, and lobster. Other good protein sources include tofu,  tempeh, beans, eggs, peanut butter, and other nut butters. Dairy Dairy is okay to eat. Continue to drink milk and milk alternatives, or eat yogurt, cheese, cottage cheese, or sour cream. Beverages Most beverages are okay. Condiments All condiments are okay. Sweets and Desserts All sweets and desserts are okay. Fats and Oils All fats and oils are okay. The items listed above may not be a complete list of recommended foods or beverages. Contact your dietitian for more options. What foods are not recommended? Meats and Other Protein Sources Avoid eating fish with high mercury content, such as tilefish, shark, swordfish, and king mackerel. Beverages Avoid drinking sugar-sweetened beverages such as sodas, teas, or energy drinks. Limit the amount of caffeine and alcohol that you drink. The items listed above may not be a complete list of foods and beverages to avoid. Contact your dietitian for more information. This information is not intended to replace advice given to you by your health care provider. Make sure you discuss any questions you have with your health care provider. Document Released: 11/10/2013 Document Revised: 08/15/2015 Document Reviewed: 08/15/2013 Elsevier Interactive Patient Education  2018 [employer] Inc.  

## 2018-04-15 IMAGING — US US OB TRANSVAGINAL
1 series · 15 of 24 positions shown · non-contrast
Comparison: Pelvic ultrasound 12/19/2015.

CLINICAL DATA: Evaluate for viability. Pregnancy with inconclusive
viability.

EXAM:
TRANSVAGINAL OB ULTRASOUND
TECHNIQUE: Transvaginal ultrasound was performed for complete evaluation of the
gestation as well as the maternal uterus, adnexal regions, and
pelvic cul-de-sac.

[Series 1: us ob transvaginal · 15 of 24 slices shown]
[im 1/24]
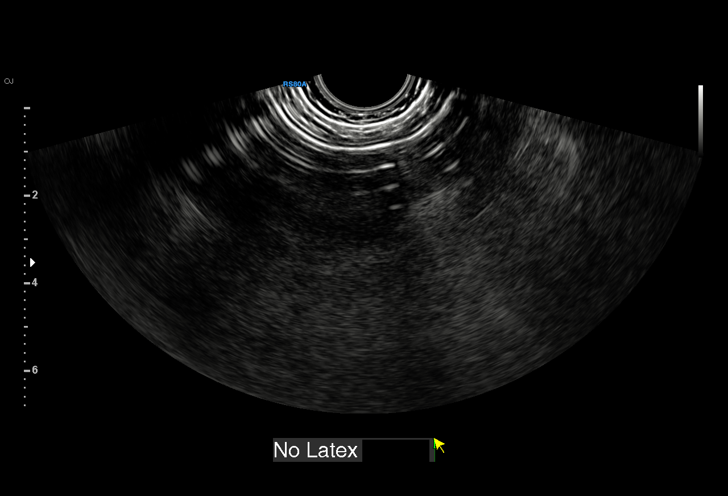
[im 3/24]
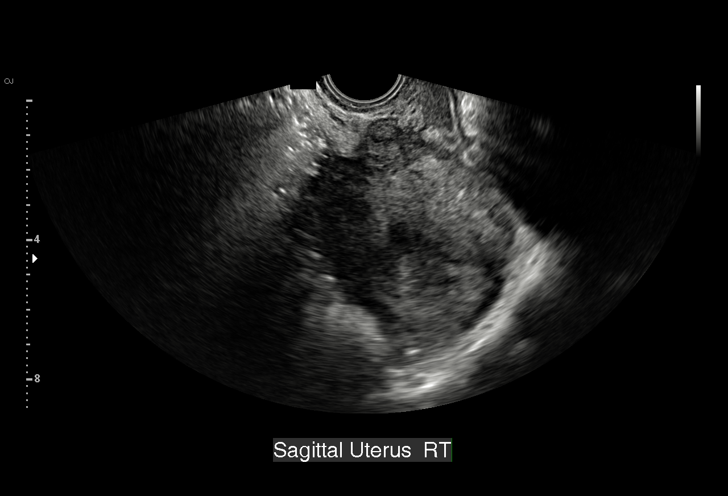
[im 5/24]
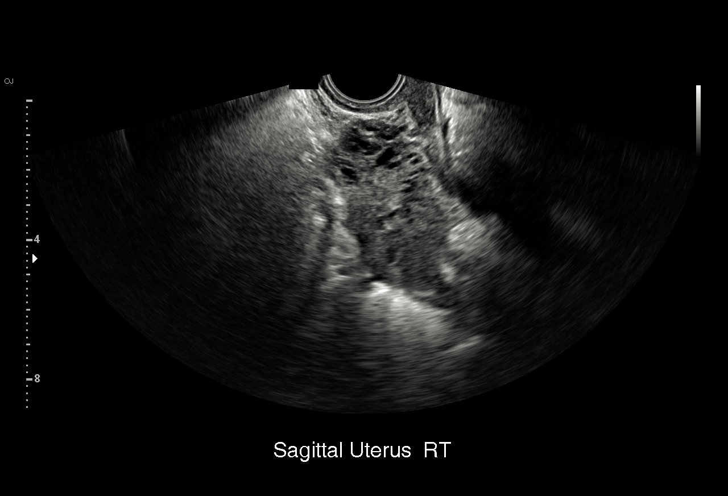
[im 6/24]
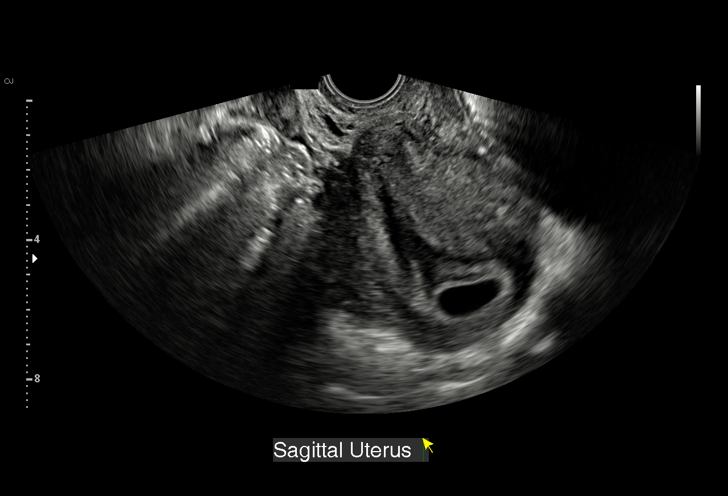
[im 8/24]
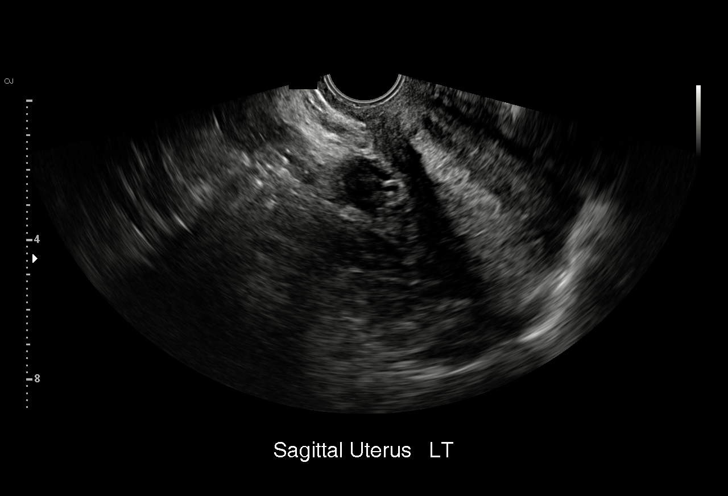
[im 9/24]
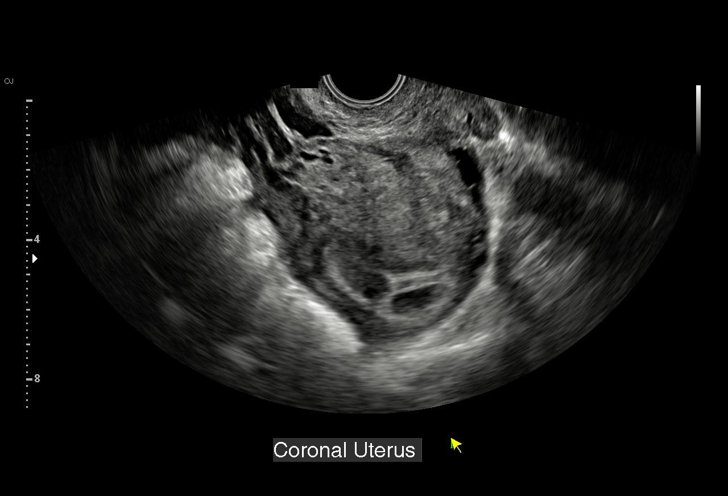
[im 11/24]
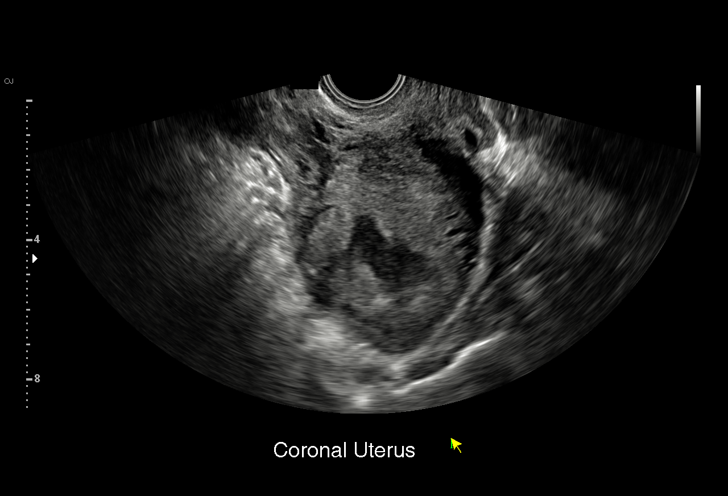
[im 13/24]
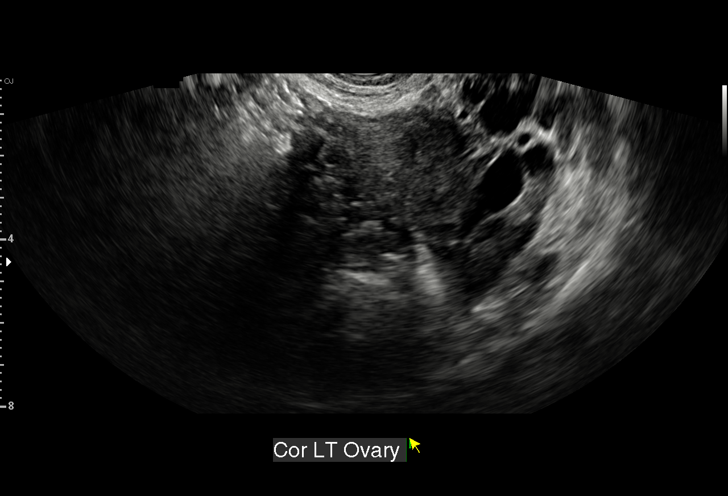
[im 14/24]
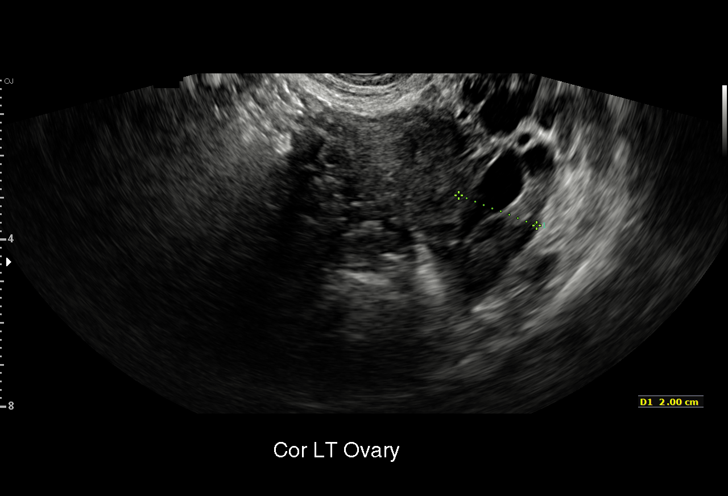
[im 16/24]
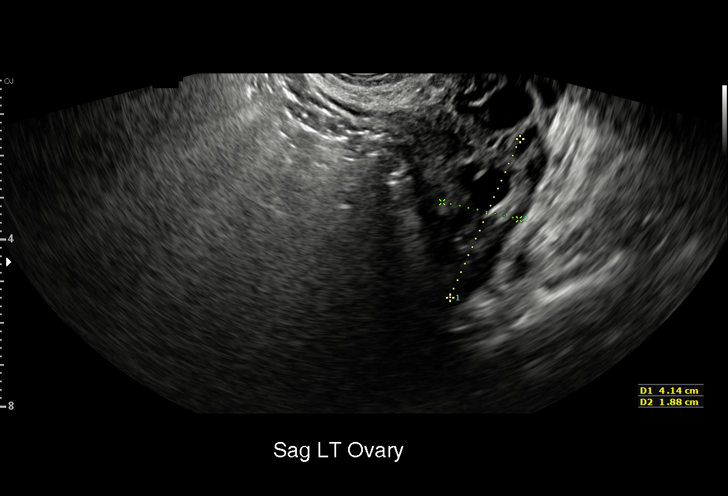
[im 17/24]
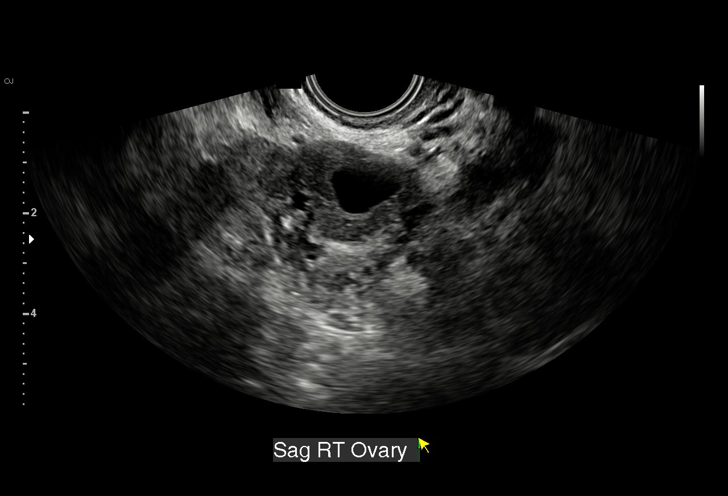
[im 19/24]
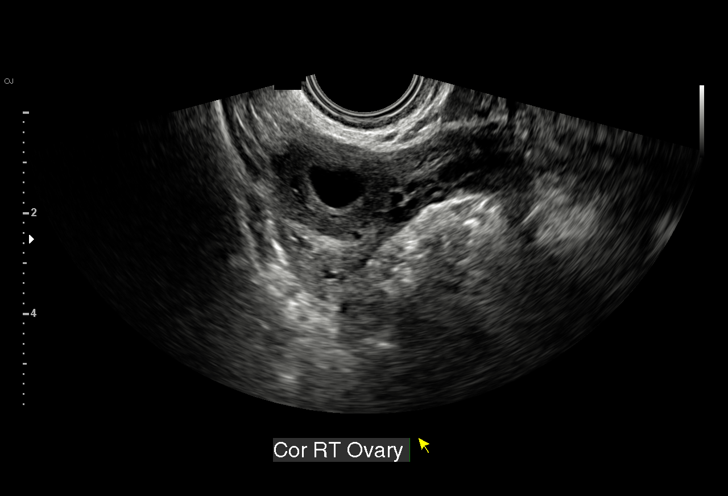
[im 21/24]
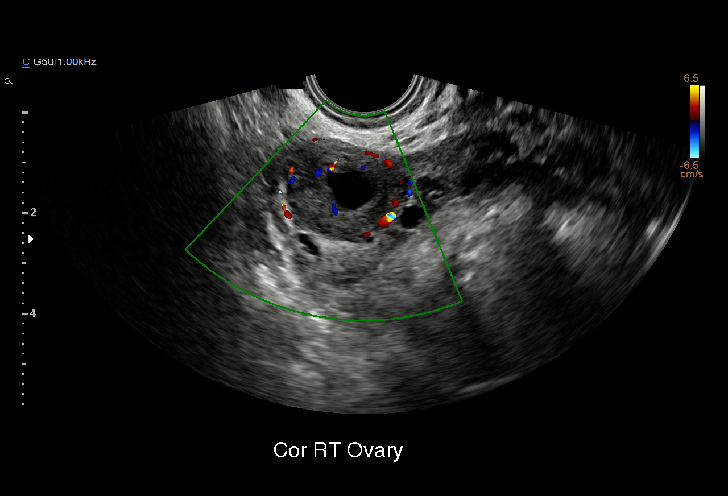
[im 22/24]
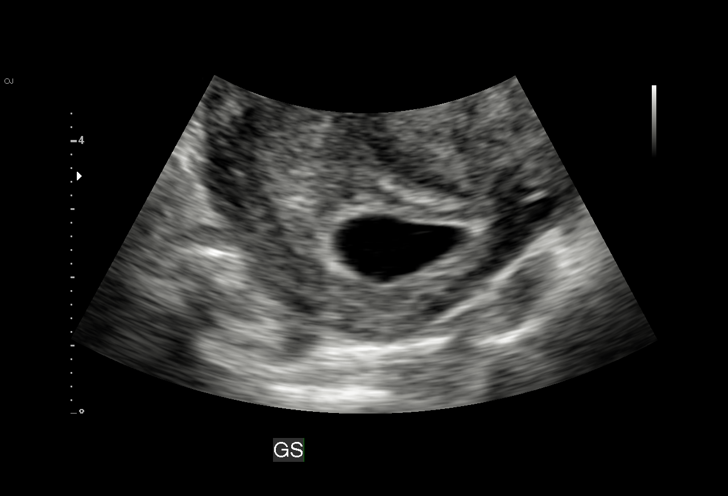
[im 24/24]
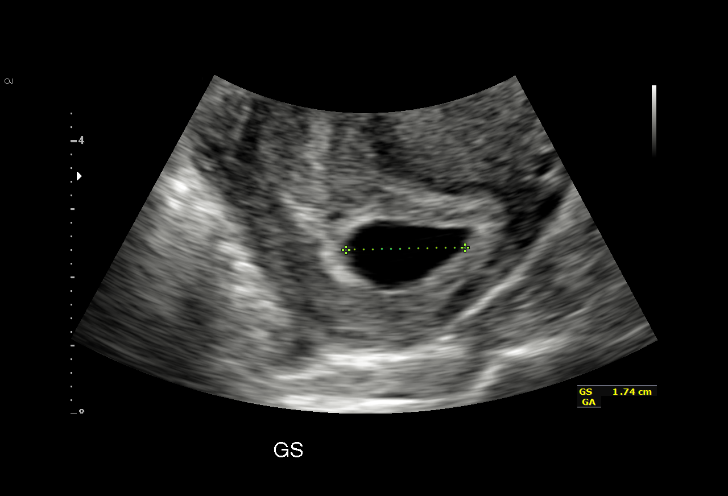

[15 of 24 positions shown; findings below may reference images not displayed]

FINDINGS: Intrauterine gestational sac: Present

Yolk sac:  Not present

Embryo:  Not present

Cardiac Activity: Not present

MSD: 14.8  mm   6 w   2  d

Subchorionic hemorrhage:  None visualized.

Maternal uterus/adnexae: Probable corpus luteum within the right
ovary. Left ovary is unremarkable.
IMPRESSION: Probable early intrauterine gestational sac, but no yolk sac, fetal
pole, or cardiac activity yet visualized. Recommend follow-up
quantitative B-HCG levels and follow-up US in 14 days to confirm and
assess viability. This recommendation follows SRU consensus
guidelines: Diagnostic Criteria for Nonviable Pregnancy Early in the
First Trimester. N Engl J Med 9275; [DATE].

## 2018-06-22 IMAGING — CT CT HEAD W/O CM
3 of 5 series · 16 of 47 positions shown, 19 images · non-contrast
Comparison: None.

CLINICAL DATA: Pain in the back of the head x3 weeks worsening over
the past 3 days.

EXAM:
CT HEAD WITHOUT CONTRAST
TECHNIQUE: Contiguous axial images were obtained from the base of the skull
through the vertex without intravenous contrast.

[Series 3: head w/o thins · axial · non-contrast · 0.39mm/px · z∈[-90,+47]mm · 10 of 128 slices shown, 13 images]
[im 7/128  brain]
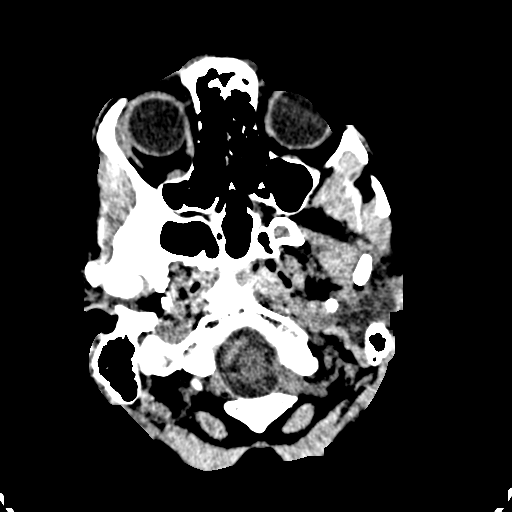
[im 7/128  bone]
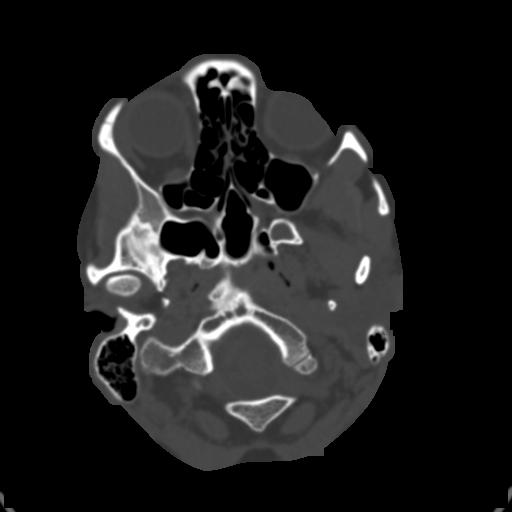
[im 21/128  brain]
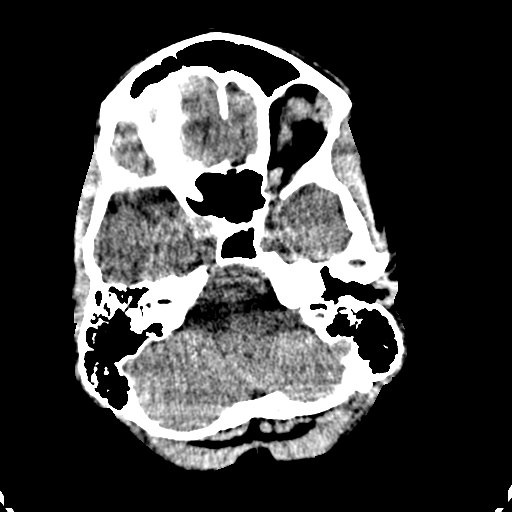
[im 34/128  brain]
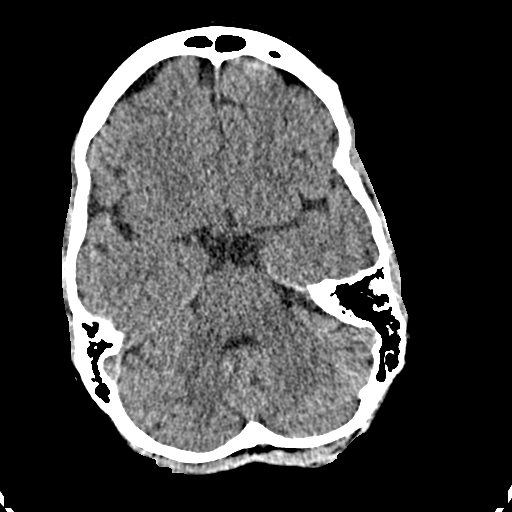
[im 47/128  brain]
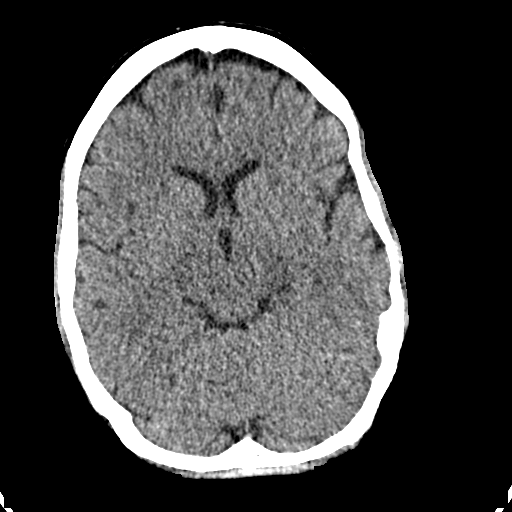
[im 61/128  brain]
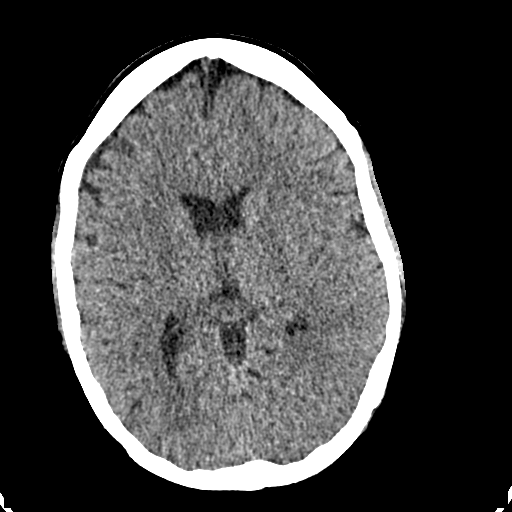
[im 61/128  bone]
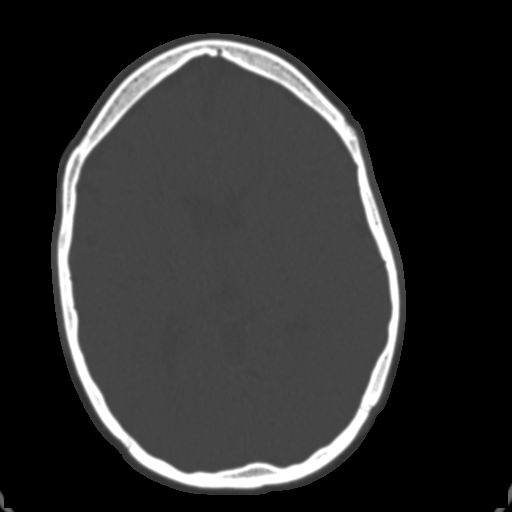
[im 67/128  brain]
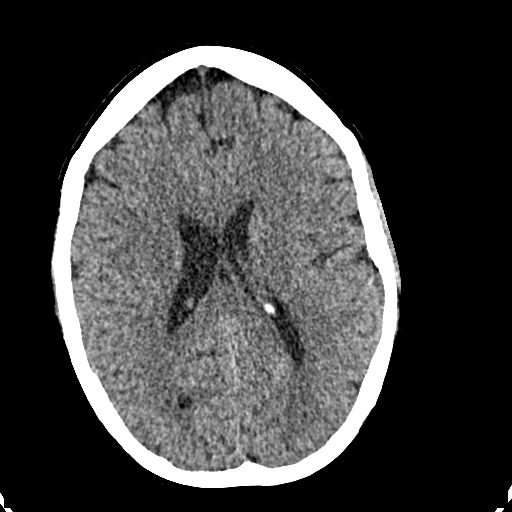
[im 81/128  brain]
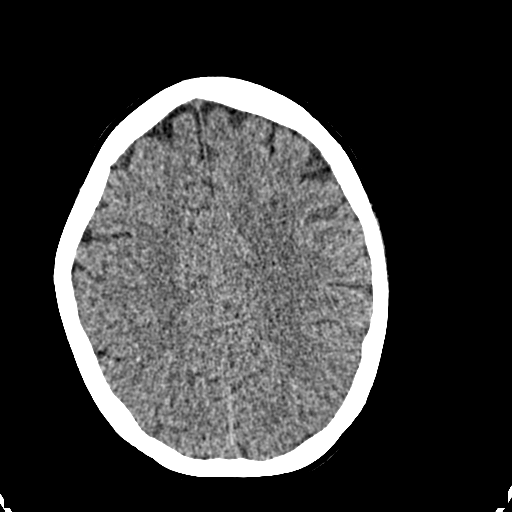
[im 94/128  brain]
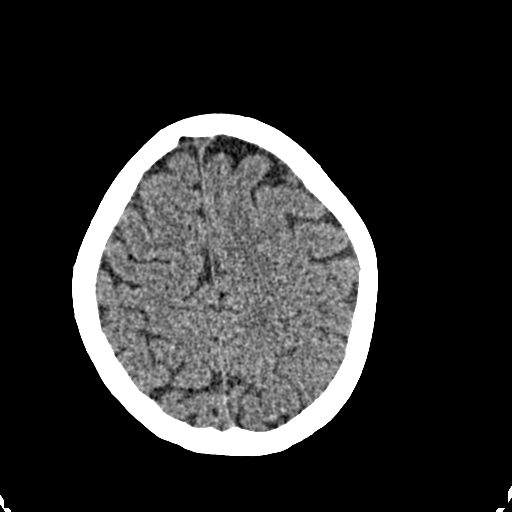
[im 107/128  brain]
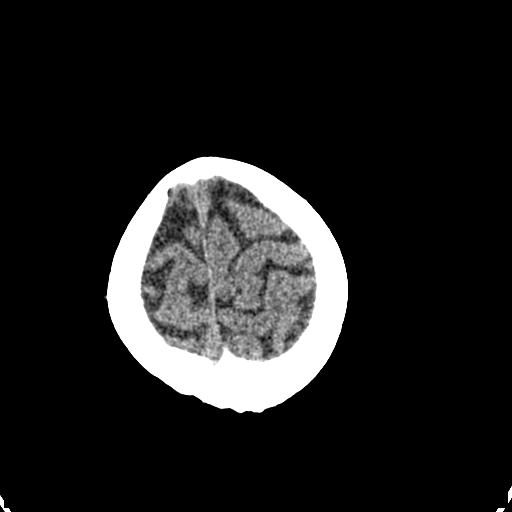
[im 107/128  bone]
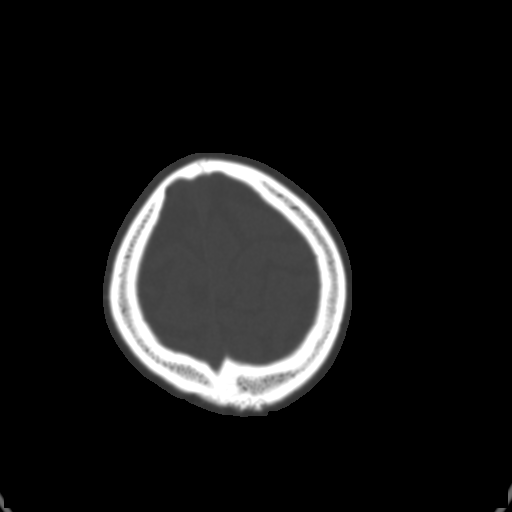
[im 121/128  brain]
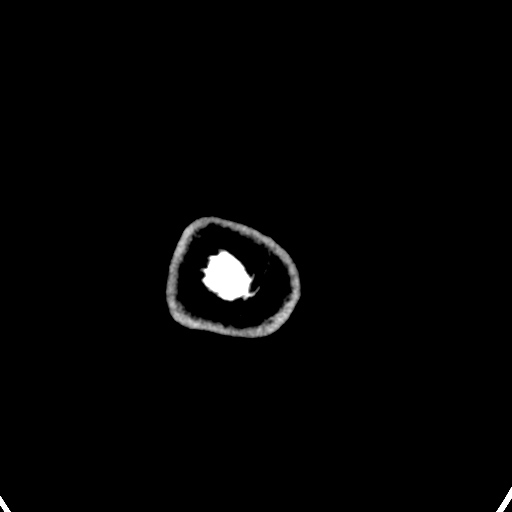

[Series 602: <mpr thick range> · coronal · 0.39mm/px · 3 of 94 slices shown]
[im 32/94  brain]
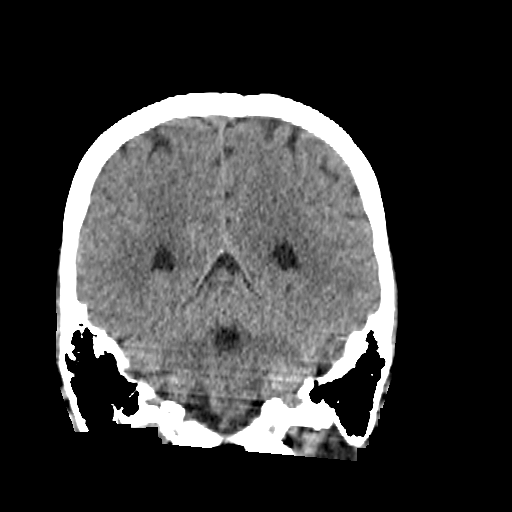
[im 42/94  brain]
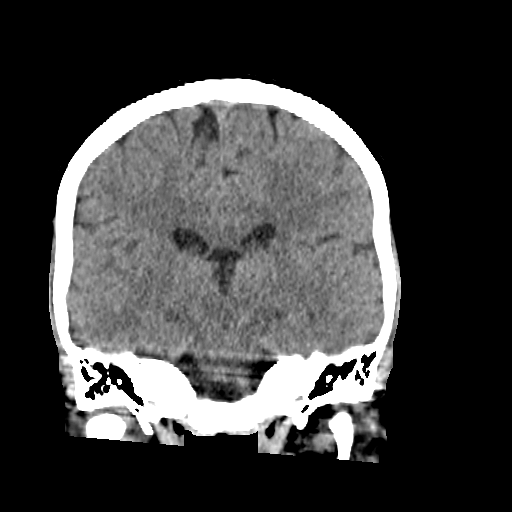
[im 52/94  brain]
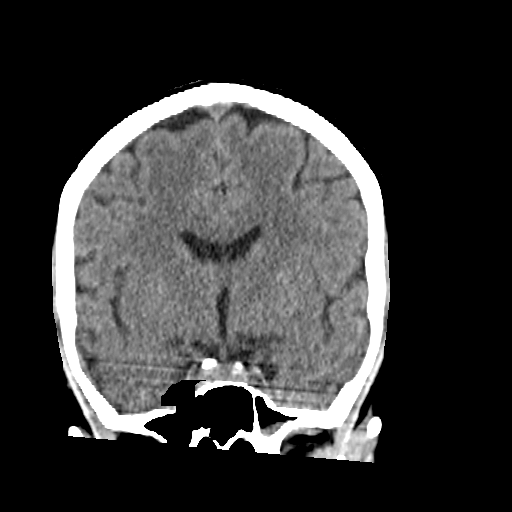

[Series 603: <mpr thick range(1)> · sagittal · 0.39mm/px · 3 of 72 slices shown]
[im 24/72  brain]
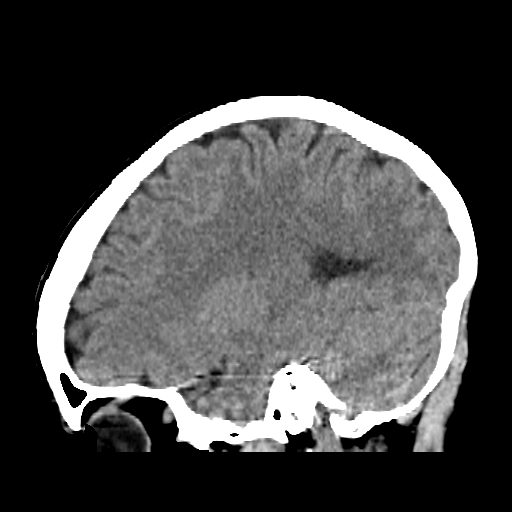
[im 36/72  brain]
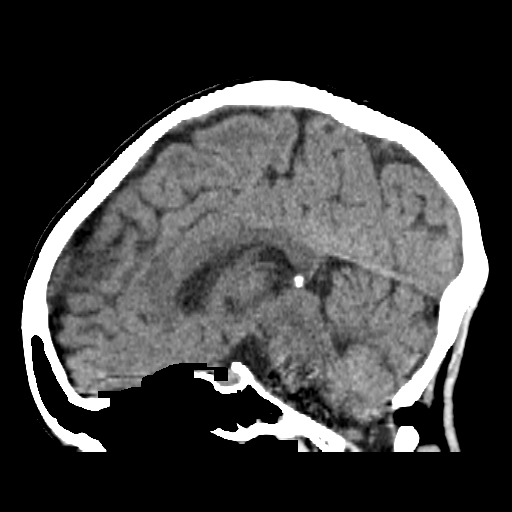
[im 48/72  brain]
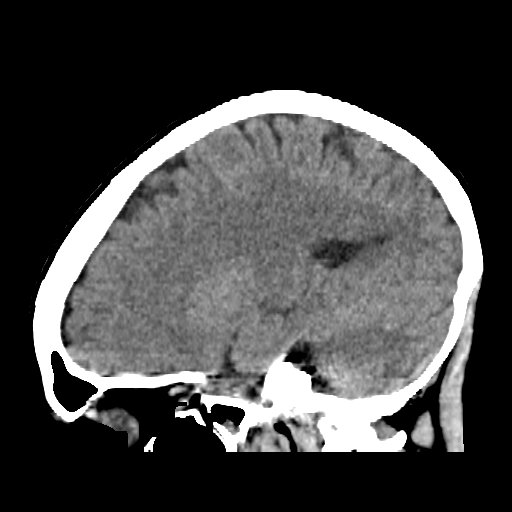

[16 of 47 positions shown; findings below may reference images not displayed]

FINDINGS: BRAIN: The ventricles and sulci are normal. No intraparenchymal
hemorrhage, mass effect nor midline shift. No acute large vascular
territory infarcts. Grey-white matter distinction is maintained. The
basal ganglia are unremarkable. No abnormal extra-axial fluid
collections. Basal cisterns are not effaced and midline. The
brainstem and cerebellar hemispheres are without acute
abnormalities.

VASCULAR: Unremarkable.

SKULL/SOFT TISSUES: No skull fracture. No significant soft tissue
swelling.

ORBITS/SINUSES: The included ocular globes and orbital contents are
normal.The mastoid air cells are clear. The included paranasal
sinuses well aerated with minimal mucous seen in the sphenoid sinus.

OTHER: None.
IMPRESSION: No acute intracranial abnormality. Minimal amount of mucus noted
within the sphenoid sinus.

## 2022-07-07 ENCOUNTER — Encounter: Payer: Self-pay | Admitting: Certified Nurse Midwife

## 2022-07-07 ENCOUNTER — Other Ambulatory Visit: Payer: Self-pay

## 2022-07-07 ENCOUNTER — Emergency Department (HOSPITAL_BASED_OUTPATIENT_CLINIC_OR_DEPARTMENT_OTHER)
Admission: EM | Admit: 2022-07-07 | Discharge: 2022-07-08 | Disposition: A | Discharge status: Home / Self Care | Payer: 59 | Attending: Emergency Medicine | Admitting: Emergency Medicine

## 2022-07-07 ENCOUNTER — Encounter (HOSPITAL_BASED_OUTPATIENT_CLINIC_OR_DEPARTMENT_OTHER): Payer: Self-pay

## 2022-07-07 ENCOUNTER — Telehealth: Payer: Self-pay | Admitting: Certified Nurse Midwife

## 2022-07-07 ENCOUNTER — Emergency Department (HOSPITAL_BASED_OUTPATIENT_CLINIC_OR_DEPARTMENT_OTHER): Payer: 59

## 2022-07-07 DIAGNOSIS — N644 Mastodynia: Secondary | ICD-10-CM

## 2022-07-07 DIAGNOSIS — R0602 Shortness of breath: Secondary | ICD-10-CM | POA: Diagnosis not present

## 2022-07-07 DIAGNOSIS — J45909 Unspecified asthma, uncomplicated: Secondary | ICD-10-CM | POA: Insufficient documentation

## 2022-07-07 NOTE — ED Triage Notes (Signed)
Pt reports LT breast pain that feels like mastitis. 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 Pt reports swelling in LT armpit area. Today she noticed bruising to the LT axilla. Hx of DVT; denies smoking and contraceptive use. Pt reports she last was on lovenox 5 years ago. Pt endorses SOB and nausea for a few days.

## 2022-07-07 NOTE — Telephone Encounter (Signed)
Pt called in, has been trying to get in touch with the office but has had sudden onset moderate mastalgia just on the left side for the past week (since ovulation). She is 7 days prior to her expected period, UPT yesterday was negative. Stopped breastfeeding a year ago and stopped producing milk entirely about 25mo ago. Says it feels like a clogged duct, but has not had soreness to this severity even when pregnant. Denies redness but says that side feels heavier, has some more prominent veins and feels a lump near the lymph node on that side. Will upload a picture.  Advised to treat as a clog with warm compresses and massage, but will also order a breast ultrasound for her to have if this does not resolve after her luteal phase. Can also follow up at her regular office ([id number]) if this does not resolve or she can see me at [facility].  Edd Arbour, CNM, MSN, IBCLC Certified Nurse Midwife, [facility]

## 2022-07-08 ENCOUNTER — Emergency Department (HOSPITAL_BASED_OUTPATIENT_CLINIC_OR_DEPARTMENT_OTHER): Payer: 59

## 2022-07-08 DIAGNOSIS — N644 Mastodynia: Secondary | ICD-10-CM | POA: Diagnosis not present

## 2022-07-08 LAB — CBC
HCT: 34.3 % — ABNORMAL LOW (ref 36.0–46.0)
Hemoglobin: 11.9 g/dL — ABNORMAL LOW (ref 12.0–15.0)
MCH: 29.7 pg (ref 26.0–34.0)
MCHC: 34.7 g/dL (ref 30.0–36.0)
MCV: 85.5 fL (ref 80.0–100.0)
Platelets: 195 10*3/uL (ref 150–400)
RBC: 4.01 MIL/uL (ref 3.87–5.11)
RDW: 12.1 % (ref 11.5–15.5)
WBC: 6.5 10*3/uL (ref 4.0–10.5)
nRBC: 0 % (ref 0.0–0.2)

## 2022-07-08 LAB — BASIC METABOLIC PANEL
Anion gap: 5 (ref 5–15)
BUN: 19 mg/dL (ref 6–20)
CO2: 24 mmol/L (ref 22–32)
Calcium: 8.4 mg/dL — ABNORMAL LOW (ref 8.9–10.3)
Chloride: 105 mmol/L (ref 98–111)
Creatinine, Ser: 0.68 mg/dL (ref 0.44–1.00)
GFR, Estimated: >60 mL/min (ref >60)
Glucose, Bld: 101 mg/dL — ABNORMAL HIGH (ref 70–99)
Potassium: 3.5 mmol/L (ref 3.5–5.1)
Sodium: 134 mmol/L — ABNORMAL LOW (ref 135–145)

## 2022-07-08 LAB — HCG, SERUM, QUALITATIVE: Preg, Serum: NEGATIVE

## 2022-07-08 LAB — D-DIMER, QUANTITATIVE: D-Dimer, Quant: <0.27 ug/mL-FEU (ref 0.00–0.50)

## 2022-07-08 MED ORDER — GABAPENTIN 100 MG PO CAPS: 100.0000 mg | ORAL_CAPSULE | Freq: Three times a day (TID) | ORAL | 1 refills | Status: DC | PRN

## 2022-07-08 MED ORDER — NAPROXEN 500 MG PO TABS: 500.0000 mg | ORAL_TABLET | Freq: Two times a day (BID) | ORAL | 0 refills | Status: DC

## 2022-07-08 MED ORDER — LIDOCAINE 5 % EX PTCH: 1.0000 | MEDICATED_PATCH | CUTANEOUS | 0 refills | Status: DC

## 2022-07-08 NOTE — ED Notes (Signed)
Reviewed AVS with patient, patient expressed understanding of directions, denies further questions at this time. 

## 2022-07-08 NOTE — Discharge Instructions (Signed)
You were evaluated in the Emergency Department and after careful evaluation, we did not find any emergent condition requiring admission or further testing in the hospital.  Your exam/testing today is overall reassuring.  No signs of blood clot.  We discussed the possibility of shingles.  Keep an eye out for a rash in the painful area.  Use the Naprosyn twice daily for pain.  Can also use the numbing patches as needed.  Use the gabapentin medication 3 times daily as needed if the other 2 medicines are not helping.  Please return to the Emergency Department if you experience any worsening of your condition.   Thank you for allowing [place] to be a part of your care.

## 2022-07-08 NOTE — ED Provider Notes (Signed)
[facility] Emergency Department Provider Note MRN:  [id number]  Arrival date & time: 07/08/22     Chief Complaint   Breast Pain and Shortness of Breath   History of Present Illness   Melinda Higgins is a 32 y.o. year-old female with a history of DVT presenting to the ED with chief complaint of breast pain.  Patient endorsing left lateral breast wrapping around under the breast.  Very sensitive skin to the breast in this area.  No rash.  Endorsing shortness of breath.  Denies chest pain, no leg pain or swelling.  History of DVT with a pregnancy in the past.  Currently not anticoagulated.  Review of Systems  A thorough review of systems was obtained and all systems are negative except as noted in the HPI and PMH.   Patient's Health History    Past Medical History:  Diagnosis Date   Anemia    Asthma    Family history of congenital anomaly of genitourinary system 04/03/2016   Previous child w/ pyelectasis requiring surgery. Husband w/ Hx Pyelectasis. MFM recommends F/U US at 28 and 36 weeks to eval for pyelectasis.   Postoperative deep vein thrombosis (DVT) 2005   after spinal surgery    Past Surgical History:  Procedure Laterality Date   BACK SURGERY     rods put in back   CESAREAN SECTION     3 times   CESAREAN SECTION N/A 08/11/2016   Procedure: CESAREAN SECTION;  Surgeon: Levie Heritage, DO;  Location: [facility];  Service: Obstetrics;  Laterality: N/A;   EYE SURGERY      Family History  Problem Relation Age of Onset   Diabetes Other    Heart disease Other    Diabetes Mother    Heart disease Mother     Social History   Socioeconomic History   Marital status: Married    Spouse name: Not on file   Number of children: Not on file   Years of education: Not on file   Highest education level: Not on file  Occupational History   Occupation: stay at home  Tobacco Use   Smoking status: Never   Smokeless tobacco: Never   Substance and Sexual Activity   Alcohol use: No   Drug use: No   Sexual activity: Yes    Birth control/protection: None  Other Topics Concern   Not on file  Social History Narrative   Not on file   Social Determinants of Health   Financial Resource Strain: Not on file  Food Insecurity: Not on file  Transportation Needs: Not on file  Physical Activity: Not on file  Stress: Not on file  Social Connections: Not on file  Intimate Partner Violence: Not on file     Physical Exam   Vitals:   07/07/22 2309 07/08/22 0015  BP: (!) 143/82 (!) 127/91  Pulse: 75 74  Resp: 18 16  Temp: 98.6 F (37 C)   SpO2: 100% 100%    CONSTITUTIONAL: Well-appearing, NAD NEURO/PSYCH:  Alert and oriented x 3, no focal deficits EYES:  eyes equal and reactive ENT/NECK:  no LAD, no JVD CARDIO: Regular rate, well-perfused, normal S1 and S2 PULM:  CTAB no wheezing or rhonchi GI/GU:  non-distended, non-tender MSK/SPINE:  No gross deformities, no edema SKIN:  no rash, atraumatic   *Additional and/or pertinent findings included in MDM below  Diagnostic and Interventional Summary    EKG Interpretation  Date/Time:  Wednesday July 08 2022  00:15:32 EDT Ventricular Rate:  73 PR Interval:  171 QRS Duration: 89 QT Interval:  387 QTC Calculation: 427 R Axis:   80 Text Interpretation: Sinus arrhythmia ST elev, probable normal early repol pattern Confirmed by Kennis Carina [id number]) on 07/08/2022 12:44:54 AM       Labs Reviewed  CBC - Abnormal; Notable for the following components:      Result Value   Hemoglobin 11.9 (*)    HCT 34.3 (*)    All other components within normal limits  BASIC METABOLIC PANEL - Abnormal; Notable for the following components:   Sodium 134 (*)    Glucose, Bld 101 (*)    Calcium 8.4 (*)    All other components within normal limits  D-DIMER, QUANTITATIVE  HCG, SERUM, QUALITATIVE    DG Chest 2 View  Final Result      Medications - No data to display    Procedures  /  Critical Care Procedures  ED Course and Medical Decision Making  Initial Impression and Ddx PE is considered but felt to be unlikely given that the pain seems to be superficial, definitely chest wall.  Hypersensitive skin in a dermatomal distribution, shingles is suspected however patient denies any history of ever having chickenpox.  There is also no rash.  Symptoms have been present for 10 days.  The breast does not have signs of erythema or increased warmth or abscess or nodules.  Past medical/surgical history that increases complexity of ED encounter: None  Interpretation of Diagnostics I personally reviewed the EKG and my interpretation is as follows: Sinus rhythm  Labs reassuring with no significant blood count or electrolyte disturbance, D-dimer negative, chest x-ray normal.  Patient Reassessment and Ultimate Disposition/Management     With reassuring workup patient is appropriate for discharge with symptomatic management, PCP follow-up.  Patient management required discussion with the following services or consulting groups:  None  Complexity of Problems Addressed Acute illness or injury that poses threat of life of bodily function  Additional Data Reviewed and Analyzed Further history obtained from: None  Additional Factors Impacting ED Encounter Risk Prescriptions  Elmer Sow. Pilar Plate, MD [facility] [email or web address]  Final Clinical Impressions(s) / ED Diagnoses     ICD-10-CM   1. Breast pain  N64.4       ED Discharge Orders          Ordered    naproxen (NAPROSYN) 500 MG tablet  2 times daily        07/08/22 0128    lidocaine (LIDODERM) 5 %  Every 24 hours        07/08/22 0128    gabapentin (NEURONTIN) 100 MG capsule  3 times daily PRN        07/08/22 0128             Discharge Instructions Discussed with and Provided to Patient:    Discharge Instructions      You were evaluated  in the Emergency Department and after careful evaluation, we did not find any emergent condition requiring admission or further testing in the hospital.  Your exam/testing today is overall reassuring.  No signs of blood clot.  We discussed the possibility of shingles.  Keep an eye out for a rash in the painful area.  Use the Naprosyn twice daily for pain.  Can also use the numbing patches as needed.  Use the gabapentin medication 3 times daily as needed if the other 2 medicines are not  helping.  Please return to the Emergency Department if you experience any worsening of your condition.   Thank you for allowing [place] to be a part of your care.      Sabas Sous, MD 07/08/22 [phone]

## 2022-07-11 ENCOUNTER — Other Ambulatory Visit: Payer: Self-pay | Admitting: Certified Nurse Midwife

## 2022-07-11 DIAGNOSIS — N644 Mastodynia: Secondary | ICD-10-CM

## 2022-07-22 ENCOUNTER — Ambulatory Visit
Admission: RE | Admit: 2022-07-22 | Discharge: 2022-07-22 | Disposition: A | Discharge status: Home / Self Care | Payer: Medicaid Other | Source: Ambulatory Visit | Attending: Certified Nurse Midwife | Admitting: Certified Nurse Midwife

## 2022-07-22 ENCOUNTER — Other Ambulatory Visit: Payer: Self-pay | Admitting: Certified Nurse Midwife

## 2022-07-22 ENCOUNTER — Ambulatory Visit
Admission: RE | Admit: 2022-07-22 | Discharge: 2022-07-22 | Disposition: A | Discharge status: Home / Self Care | Payer: 59 | Source: Ambulatory Visit | Attending: Certified Nurse Midwife | Admitting: Certified Nurse Midwife

## 2022-07-22 ENCOUNTER — Ambulatory Visit
Admission: RE | Admit: 2022-07-22 | Discharge: 2022-07-22 | Disposition: A | Discharge status: Home / Self Care | Payer: 59 | Source: Ambulatory Visit | Attending: Certified Nurse Midwife

## 2022-07-22 DIAGNOSIS — N644 Mastodynia: Secondary | ICD-10-CM

## 2023-08-24 ENCOUNTER — Other Ambulatory Visit: Payer: Self-pay | Admitting: Certified Nurse Midwife

## 2023-08-24 DIAGNOSIS — Z1231 Encounter for screening mammogram for malignant neoplasm of breast: Secondary | ICD-10-CM

## 2023-08-31 ENCOUNTER — Ambulatory Visit
Admission: RE | Admit: 2023-08-31 | Discharge: 2023-08-31 | Disposition: A | Discharge status: Home / Self Care | Source: Ambulatory Visit | Attending: Certified Nurse Midwife

## 2023-08-31 DIAGNOSIS — Z1231 Encounter for screening mammogram for malignant neoplasm of breast: Secondary | ICD-10-CM

## 2023-10-06 ENCOUNTER — Ambulatory Visit: Admitting: Certified Nurse Midwife

## 2023-10-06 ENCOUNTER — Other Ambulatory Visit: Payer: Self-pay

## 2023-10-06 ENCOUNTER — Encounter: Payer: Self-pay | Admitting: Certified Nurse Midwife

## 2023-10-06 VITALS — BP 126/79 | HR 67 | Ht 65.0 in | Wt 137.0 lb

## 2023-10-06 DIAGNOSIS — Z8041 Family history of malignant neoplasm of ovary: Secondary | ICD-10-CM

## 2023-10-06 DIAGNOSIS — Z803 Family history of malignant neoplasm of breast: Secondary | ICD-10-CM

## 2023-10-06 DIAGNOSIS — F901 Attention-deficit hyperactivity disorder, predominantly hyperactive type: Secondary | ICD-10-CM | POA: Diagnosis not present

## 2023-10-06 DIAGNOSIS — R5383 Other fatigue: Secondary | ICD-10-CM | POA: Diagnosis not present

## 2023-10-06 DIAGNOSIS — Z01419 Encounter for gynecological examination (general) (routine) without abnormal findings: Secondary | ICD-10-CM

## 2023-10-06 DIAGNOSIS — Z800 Family history of malignant neoplasm of digestive organs: Secondary | ICD-10-CM

## 2023-10-07 LAB — CBC WITH DIFFERENTIAL/PLATELET
Basophils Absolute: 0.1 x10E3/uL (ref 0.0–0.2)
Basos: 1 %
EOS (ABSOLUTE): 0.3 x10E3/uL (ref 0.0–0.4)
Eos: 4 %
Hematocrit: 39 % (ref 34.0–46.6)
Hemoglobin: 12.6 g/dL (ref 11.1–15.9)
Immature Grans (Abs): 0 x10E3/uL (ref 0.0–0.1)
Immature Granulocytes: 0 %
Lymphocytes Absolute: 1.3 x10E3/uL (ref 0.7–3.1)
Lymphs: 22 %
MCH: 27.2 pg (ref 26.6–33.0)
MCHC: 32.3 g/dL (ref 31.5–35.7)
MCV: 84 fL (ref 79–97)
Monocytes Absolute: 0.3 x10E3/uL (ref 0.1–0.9)
Monocytes: 5 %
Neutrophils Absolute: 3.8 x10E3/uL (ref 1.4–7.0)
Neutrophils: 68 %
Platelets: 251 x10E3/uL (ref 150–450)
RBC: 4.64 x10E6/uL (ref 3.77–5.28)
RDW: 14.4 % (ref 11.7–15.4)
WBC: 5.7 x10E3/uL (ref 3.4–10.8)

## 2023-10-07 LAB — IRON,TIBC AND FERRITIN PANEL
Ferritin: 10 ng/mL — ABNORMAL LOW (ref 15–150)
Iron Saturation: 11 % — ABNORMAL LOW (ref 15–55)
Iron: 42 ug/dL (ref 27–159)
Total Iron Binding Capacity: 394 ug/dL (ref 250–450)
UIBC: 352 ug/dL (ref 131–425)

## 2023-10-07 MED ORDER — LISDEXAMFETAMINE DIMESYLATE 30 MG PO CAPS: 30.0000 mg | ORAL_CAPSULE | Freq: Every day | ORAL | 0 refills | Status: DC

## 2023-10-07 NOTE — Progress Notes (Signed)
 ANNUAL EXAM Patient name: Melinda Higgins MRN [id number]  Date of birth: 1990-05-16 Chief Complaint:   Gynecologic Exam  History of Present Illness:   Melinda Higgins is a 33 y.o. [id number] female of [place] descent being seen today for a routine annual exam.  Current complaints: Still having regular periods but notes an increase in her ADHD symptoms, PMS symptoms and overall feeling kind of run down and fatigued. Unsure if her fatigue is related to a hormonal imbalance, vitamin deficiency or just her ADHD being untreated which makes it difficult for her to eat regularly, hydrate or work out consistently. She has been medicated for ADHD since she was a child, recently stopped taking Vyvanse  because her PCP retired. Otherwise doing well.   Patient's last menstrual period was 10/06/2023.  Last pap >79yrs ago but started her period today and wants to defer. Results were: NILM w/ HRHPV negative. H/O abnormal pap: no Last mammogram: 07/22/22. Results were: normal. Family h/o breast cancer: yes   Last colonoscopy: Never (age). Results were: N/A. Family h/o colorectal cancer: no     10/06/2023    9:15 AM 06/15/2016   11:03 AM  Depression screen PHQ 2/9  Decreased Interest 1 3  Down, Depressed, Hopeless 1 3  PHQ - 2 Score 2 6  Altered sleeping 0 3  Tired, decreased energy 2 3  Change in appetite  0  Feeling bad or failure about yourself  1 3  Trouble concentrating 2 3  Moving slowly or fidgety/restless 2 2  Suicidal thoughts 0 0   PHQ-9 Score 9 20     Data saved with a previous flowsheet row definition        10/06/2023    9:15 AM 06/15/2016   11:04 AM  GAD 7 : Generalized Anxiety Score  Nervous, Anxious, on Edge 2 3  Control/stop worrying 2 3  Worry too much - different things 2 3  Trouble relaxing 2 3  Restless 3 3  Easily annoyed or irritable 1 3  Afraid - awful might happen 1 3  Total GAD 7 Score 13 21     Review of Systems:   Pertinent items are noted in HPI Denies any  headaches, blurred vision, fatigue, shortness of breath, chest pain, abdominal pain, abnormal vaginal discharge/itching/odor/irritation, problems with periods, bowel movements, urination, or intercourse unless otherwise stated above.  Pertinent History Reviewed:  Reviewed past medical,surgical, social and family history.  Reviewed problem list, medications and allergies.  Physical Assessment:   Vitals:   10/06/23 0839  BP: 126/79  Pulse: 67  Weight: 137 lb (62.1 kg)  Height: 5' 5 (1.651 m)   Body mass index is 22.8 kg/m.   Physical Examination:  General appearance - well appearing, and in no distress Mental status - alert, oriented to person, place, and time Psych:  She has a normal mood and affect Skin - warm and dry, normal color, no suspicious lesions noted Chest - effort normal, no problems with respiration noted Heart - normal rate and regular rhythm Neck:  midline trachea, no thyromegaly or nodules Breasts - breasts appear normal Abdomen - soft, nontender, nondistended, no masses or organomegaly Pelvic - exam deferred Extremities:  No swelling or varicosities noted  Chaperone present for exam  No results found for this or any previous visit (from the past 24 hours).  Assessment & Plan:  1. Encounter for annual routine gynecological examination (Primary) - Will get pap scheduled in 6 weeks  2. Attention deficit hyperactivity  disorder (ADHD), predominantly hyperactive type - lisdexamfetamine (VYVANSE ) 30 MG capsule; Take 1 capsule (30 mg total) by mouth daily.  Dispense: 90 capsule; Refill: 0  3. Other fatigue - Hormone Panel - Cortisol - Vitamin D (25 hydroxy) - CBC with Differential/Platelet - Iron, TIBC and Ferritin Panel  4. Family history of ovarian cancer - [employer] G[facility] Guidelines (2+17)  5. Family history of colon cancer - [employer] GYN Guidelines (2+17)  6. Family history of breast cancer - Due to strong family history of breast cancer, MRI is  indicated, order placed - [employer]ower GYN Guidelines (2+17)  Colonoscopy: @ 34yo, or sooner if problems  Orders Placed This Encounter  Procedures   MR BREAST BILATERAL WO CONTRAST   Hormone Panel   Cortisol   Vitamin D (25 hydroxy)   CBC with Differential/Platelet   Iron, TIBC and Ferritin Panel   [employer] GYN Guidelines (2+17)   Meds:  Meds ordered this encounter  Medications   lisdexamfetamine (VYVANSE ) 30 MG capsule    Sig: Take 1 capsule (30 mg total) by mouth daily.    Dispense:  90 capsule    Refill:  0   Follow-up: Return in about 1 year (around 10/05/2024), or as needed, for ANN.  Cornell JONELLE Finder, CNM 10/07/2023 9:53 AM

## 2023-10-12 ENCOUNTER — Telehealth: Payer: Self-pay | Admitting: Lactation Services

## 2023-10-12 NOTE — Telephone Encounter (Signed)
 PA request faxed to Covermymeds. Awaiting determination.    Melinda Higgins (Key: [id number]) PA Case ID #: [id number] Rx #: [id number] Need Help? Call us  at [phone] Status sent iconSent to Plan today Drug Lisdexamfetamine Dimesylate  30MG  capsules ePA cloud logo Form [employer] Electronic Prior Authorization Form Original Claim Info 7X,75 Max Days Supply 34 Days. . Call [phone]. For a 3 day temporary supply, submit DUR PPS Level of Service Code 03.Use Preferred Brand. Consider SCC 10 override for qty limit [9G reject], after pr

## 2023-10-13 NOTE — Telephone Encounter (Signed)
 Melinda Higgins (Key: [id number]) PA Case ID #: [id number] Rx #: [id number] Need Help? Call us  at [phone] Outcome N/A on July 22 by [employer] 2017 No Authorization Required.Prior Authorization Not Required.We thank you for taking the time to submit your request. However, [Brand Name] Vyvanse  Oral Capsule 30 MG is preferred and does not require Prior Authorization. If the patient is still having issues receiving this medication, please have the pharmacy contact Pharmacy Services for assistance at [phone]. Drug Lisdexamfetamine Dimesylate  30MG  capsules ePA cloud logo Form [employer] Electronic Prior Authorization Form Original Claim Info 7X,75 Max Days Supply 34 Days. . Call [phone]. For a 3 day temporary supply, submit DUR PPS Level of Service Code 03.Use Preferred Brand. Consider SCC 10 override for qty limit [9G reject], after pr  Called Pharmacy to let them know PA has been approved. Reviewed PA reports max 34 day supply. They will try to run a 90 day supply and if not approved will dispense a 30 day supply.   Called patient to let her know above information. Let her know Pharmacist is working on.

## 2023-10-17 LAB — EMPOWER GYN GUIDELINES (2+17): REPORT SUMMARY: NEGATIVE

## 2023-10-21 LAB — HORMONE PANEL (T4,TSH,FSH,TESTT,SHBG,DHEA,ETC)
DHEA-Sulfate, LCMS: 185 ug/dL
Estradiol, Serum, MS: 33 pg/mL
Estrone Sulfate: 67 ng/dL
Follicle Stimulating Hormone: 4.8 m[IU]/mL
Free T-3: 3.1 pg/mL
Free Testosterone, Serum: 1.7 pg/mL
Progesterone, Serum: 21 ng/dL
Sex Hormone Binding Globulin: 80.3 nmol/L
T4: 9.8 ug/dL
TSH: 1.1 uU/mL
Testosterone, Serum (Total): 21 ng/dL
Testosterone-% Free: 0.8 %
Triiodothyronine (T-3), Serum: 119 ng/dL

## 2023-10-21 LAB — CORTISOL: Cortisol: 12.9 ug/dL (ref 6.2–19.4)

## 2023-10-21 LAB — VITAMIN D 25 HYDROXY (VIT D DEFICIENCY, FRACTURES): Vit D, 25-Hydroxy: 16.9 ng/mL — ABNORMAL LOW (ref 30.0–100.0)

## 2023-10-22 ENCOUNTER — Ambulatory Visit: Payer: Self-pay | Admitting: Certified Nurse Midwife

## 2023-10-22 DIAGNOSIS — E559 Vitamin D deficiency, unspecified: Secondary | ICD-10-CM

## 2023-10-22 MED ORDER — VITAMIN D (ERGOCALCIFEROL) 1.25 MG (50000 UNIT) PO CAPS: 50000.0000 [IU] | ORAL_CAPSULE | ORAL | 0 refills | Status: AC

## 2023-10-30 ENCOUNTER — Emergency Department (HOSPITAL_BASED_OUTPATIENT_CLINIC_OR_DEPARTMENT_OTHER)
Admission: EM | Admit: 2023-10-30 | Discharge: 2023-10-30 | Disposition: A | Discharge status: Home / Self Care | Attending: Emergency Medicine | Admitting: Emergency Medicine

## 2023-10-30 ENCOUNTER — Emergency Department (HOSPITAL_BASED_OUTPATIENT_CLINIC_OR_DEPARTMENT_OTHER)

## 2023-10-30 ENCOUNTER — Other Ambulatory Visit: Payer: Self-pay

## 2023-10-30 ENCOUNTER — Encounter (HOSPITAL_BASED_OUTPATIENT_CLINIC_OR_DEPARTMENT_OTHER): Payer: Self-pay

## 2023-10-30 DIAGNOSIS — K047 Periapical abscess without sinus: Secondary | ICD-10-CM | POA: Diagnosis not present

## 2023-10-30 DIAGNOSIS — J45909 Unspecified asthma, uncomplicated: Secondary | ICD-10-CM | POA: Insufficient documentation

## 2023-10-30 DIAGNOSIS — Z91010 Allergy to peanuts: Secondary | ICD-10-CM | POA: Diagnosis not present

## 2023-10-30 DIAGNOSIS — R220 Localized swelling, mass and lump, head: Secondary | ICD-10-CM | POA: Diagnosis present

## 2023-10-30 LAB — CBC WITH DIFFERENTIAL/PLATELET
Abs Immature Granulocytes: 0.03 K/uL (ref 0.00–0.07)
Basophils Absolute: 0.1 K/uL (ref 0.0–0.1)
Basophils Relative: 1 %
Eosinophils Absolute: 0.2 K/uL (ref 0.0–0.5)
Eosinophils Relative: 2 %
HCT: 34.4 % — ABNORMAL LOW (ref 36.0–46.0)
Hemoglobin: 11.5 g/dL — ABNORMAL LOW (ref 12.0–15.0)
Immature Granulocytes: 0 %
Lymphocytes Relative: 22 %
Lymphs Abs: 1.6 K/uL (ref 0.7–4.0)
MCH: 27.3 pg (ref 26.0–34.0)
MCHC: 33.4 g/dL (ref 30.0–36.0)
MCV: 81.7 fL (ref 80.0–100.0)
Monocytes Absolute: 0.6 K/uL (ref 0.1–1.0)
Monocytes Relative: 8 %
Neutro Abs: 4.9 K/uL (ref 1.7–7.7)
Neutrophils Relative %: 67 %
Platelets: 190 K/uL (ref 150–400)
RBC: 4.21 MIL/uL (ref 3.87–5.11)
RDW: 14.6 % (ref 11.5–15.5)
WBC: 7.3 K/uL (ref 4.0–10.5)
nRBC: 0 % (ref 0.0–0.2)

## 2023-10-30 LAB — BASIC METABOLIC PANEL WITH GFR
Anion gap: 12 (ref 5–15)
BUN: 13 mg/dL (ref 6–20)
CO2: 22 mmol/L (ref 22–32)
Calcium: 9.1 mg/dL (ref 8.9–10.3)
Chloride: 105 mmol/L (ref 98–111)
Creatinine, Ser: 0.71 mg/dL (ref 0.44–1.00)
GFR, Estimated: >60 mL/min (ref >60)
Glucose, Bld: 96 mg/dL (ref 70–99)
Potassium: 4.3 mmol/L (ref 3.5–5.1)
Sodium: 139 mmol/L (ref 135–145)

## 2023-10-30 LAB — HCG, QUANTITATIVE, PREGNANCY: hCG, Beta Chain, Quant, S: <1 m[IU]/mL (ref <5)

## 2023-10-30 MED ORDER — CLINDAMYCIN HCL 150 MG PO CAPS: 300.0000 mg | ORAL_CAPSULE | Freq: Once | ORAL | Status: DC

## 2023-10-30 MED ORDER — IOHEXOL 300 MG/ML  SOLN
80.0000 mL | Freq: Once | INTRAMUSCULAR | Status: AC | PRN
  Administered 2023-10-30: 80 mL via INTRAVENOUS

## 2023-10-30 MED ORDER — KETOROLAC TROMETHAMINE 15 MG/ML IJ SOLN
10.0000 mg | Freq: Once | INTRAMUSCULAR | Status: AC
  Administered 2023-10-30: 10 mg via INTRAVENOUS
  Filled 2023-10-30: qty 1

## 2023-10-30 MED ORDER — AMOXICILLIN-POT CLAVULANATE 875-125 MG PO TABS: 1.0000 | ORAL_TABLET | Freq: Two times a day (BID) | ORAL | 0 refills | Status: AC

## 2023-10-30 MED ORDER — AMOXICILLIN-POT CLAVULANATE 875-125 MG PO TABS
1.0000 | ORAL_TABLET | Freq: Once | ORAL | Status: AC
  Administered 2023-10-30: 1 via ORAL
  Filled 2023-10-30: qty 1

## 2023-10-30 MED ORDER — AMOXICILLIN-POT CLAVULANATE 875-125 MG PO TABS: 1.0000 | ORAL_TABLET | Freq: Once | ORAL | Status: DC

## 2023-10-30 NOTE — Discharge Instructions (Addendum)
 While you were in the emergency room, he had blood work done that was normal.  Your CT scan of your face showed a questionable developing infection.  I have started you on some augmentin  which is slightly different antibiotic than what you have been taking.  You may take one pill every 12 hours.  Please call your dental surgeon on Monday and let them know that you are seen in the emergency department.  They may want to have you come into the office for repeat visit.  Return the emergency room if you develop fever, difficulty swallowing, worsening pain or swelling in your face

## 2023-10-30 NOTE — ED Triage Notes (Addendum)
 Arrives POV with complaints of left side jaw swelling that started yesterday. Patient had all of her wisdom teeth removed 4 days ago.  She is currently on Amoxicillin  3x a day and Ibuprofen  600mg 

## 2023-10-30 NOTE — ED Provider Notes (Signed)
 [place] EMERGENCY DEPARTMENT AT [facility] Provider Note  CSN: [id number] Arrival date & time: 10/30/23 1744  Chief Complaint(s) Facial Swelling  HPI Melinda Higgins is a 33 y.o. female who is here today with left-sided facial swelling.  Patient had all of her wisdom teeth removed 4 days ago.  Started develop swelling last evening.  Has not had fever or chills.  She has been taking Augmentin  and ibuprofen  since the surgery.   Past Medical History Past Medical History:  Diagnosis Date   Anemia    Asthma    Family history of congenital anomaly of genitourinary system 04/03/2016   Previous child w/ pyelectasis requiring surgery. Husband w/ Hx Pyelectasis. MFM recommends F/U US  at 28 and 36 weeks to eval for pyelectasis.   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 Authorizing Provider  albuterol  (PROVENTIL  HFA;VENTOLIN  HFA) 108 (90 Base) MCG/ACT inhaler Inhale 2 puffs into the lungs every 6 (six) hours as needed for wheezing or shortness of breath.    [provider]  lisdexamfetamine (VYVANSE ) 30 MG capsule Take 1 capsule (30 mg total) by mouth daily. 10/07/23   Vannie Matar R, CNM  Vitamin D , Ergocalciferol , (DRISDOL ) 1.25 MG (50000 UNIT) CAPS capsule Take 1 capsule (50,000 Units total) by mouth every 7 (seven) days. 10/22/23   Vannie Matar SAUNDERS, CNM                                                                                                                                    Past Surgical  History Past Surgical History:  Procedure Laterality Date   BACK SURGERY     rods put in back   CESAREAN SECTION     3 times   CESAREAN SECTION N/A 08/11/2016   Procedure: CESAREAN SECTION;  Surgeon: Barbra Lang PARAS, DO;  Location: [facility];  Service: Obstetrics;  Laterality: N/A;   EYE SURGERY     Family History Family History  Problem Relation Age of Onset   Breast cancer Mother    Diabetes Mother    Heart disease Mother    Diabetes Other    Heart disease Other     Social History Social History   Tobacco Use   Smoking status: Never   Smokeless tobacco: Never  Vaping Use   Vaping status: Never Used  Substance Use Topics   Alcohol use: No   Drug use: No   Allergies Allergy relief  [chlorpheniramine maleate], Cabbage, Soy  allergy (obsolete), Benadryl  [diphenhydramine  hcl], Diphenhydramine  hcl, Lactose intolerance (gi), Macadamia nut oil, Morphine  and codeine, Peanut-containing drug products, and Zofran  [ondansetron  hcl]  Review of Systems Review of Systems  Physical Exam Vital Signs  I have reviewed the triage vital signs BP 110/66 (BP Location: Left Arm)   Pulse (!) 56   Temp 98.6 F (37 C)   Resp 17   Ht 5' 5 (1.651 m)   Wt 59.9 kg   LMP 10/06/2023   SpO2 100%   BMI 21.97 kg/m   Physical Exam Vitals and nursing note reviewed.  HENT:     Mouth/Throat:     Mouth: Mucous membranes are dry.     Comments: No swelling of the floor of the mouth.  Left mandibular teeth sockets without signs of abscess, inflammation.  There is swelling at the left angle of the mandible.  No overlying erythema or warmth. Pulmonary:     Effort: Pulmonary effort is normal.  Neurological:     Mental Status: She is alert.     ED Results and Treatments Labs (all labs ordered are listed, but only abnormal results are displayed) Labs Reviewed  CBC WITH DIFFERENTIAL/PLATELET - Abnormal; Notable for the following components:      Result Value   Hemoglobin 11.5 (*)     HCT 34.4 (*)    All other components within normal limits  BASIC METABOLIC PANEL WITH GFR  HCG, QUANTITATIVE, PREGNANCY                                                                                                                          Radiology CT Maxillofacial W Contrast Result Date: 10/30/2023 CLINICAL DATA:  Initial evaluation for acute facial swelling, recent dental extraction. EXAM: CT MAXILLOFACIAL W/ CM TECHNIQUE: Multidetector CT imaging of the maxillofacial structures was performed with intravenous contrast. Multiplanar CT image reconstructions were also generated. RADIATION DOSE REDUCTION: This exam was performed according to the departmental dose-optimization program which includes automated exposure control, adjustment of the mA and/or kV according to patient size and/or use of iterative reconstruction technique. CONTRAST:  80mL OMNIPAQUE  IOHEXOL  300 MG/ML  SOLN COMPARISON:  None Available. FINDINGS: Osseous: No acute osseous finding. No discrete or worrisome osseous lesions. Sequelae of recent wisdom tooth extraction noted. Orbits: Globes and orbital soft tissues within normal limits. Sinuses: Mild scattered mucoperiosteal thickening present about the paranasal sinuses. No air-fluid levels. Mastoid air cells and middle ear cavities are clear. Soft tissues: Soft tissue swelling with inflammatory stranding seen involving the soft tissues of the left lower face, primarily involving the left masticator and submandibular spaces with extension into the submental region. Finding concerning for infection/cellulitis. An odontogenic source is suspected given the recent wisdom tooth extraction. No discrete abscess or drainable fluid collection. Mild asymmetric enlargement and heterogeneity about the left submandibular gland noted, favored to be reactive. Few mildly prominent left submandibular lymph nodes noted, presumably reactive. Limited intracranial: Unremarkable. IMPRESSION: Soft tissue  swelling with inflammatory stranding  involving the soft tissues of the left lower face, concerning for infection/cellulitis. An odontogenic source is suspected given history of wisdom tooth extraction. No discrete abscess or drainable fluid collection. Electronically Signed   By: Morene Hoard M.D.   On: 10/30/2023 21:04    Pertinent labs & imaging results that were available during my care of the patient were reviewed by me and considered in my medical decision making (see MDM for details).  Medications Ordered in ED Medications  clindamycin  (CLEOCIN ) capsule 300 mg (has no administration in time range)  ketorolac  (TORADOL ) 15 MG/ML injection 10 mg (10 mg Intravenous Given 10/30/23 1914)  iohexol  (OMNIPAQUE ) 300 MG/ML solution 80 mL (80 mLs Intravenous Contrast Given 10/30/23 2014)                                                                                                                                     Procedures Procedures  (including critical care time)  Medical Decision Making / ED Course   This patient presents to the ED for concern of facial swelling following wisdom tooth removal, this involves an extensive number of treatment options, and is a complaint that carries with it a high risk of complications and morbidity.  The differential diagnosis includes postoperative swelling, hematoma, infection.  MDM: Patient without systemic signs of infection, overall looks well.  She does not have any trismus.  No swelling of the floor of the mouth, lower suspicion for Ludwig's.  Suspect this is likely postoperative swelling and inflammation.  Patient showed me some pictures from her phone from yesterday, does appear to be a bit better than yesterday.  Will check labs, obtain imaging of the patient's face.  Reassessment 9:15 PM-question of developing infection.  Will switch the patient from her amoxicillin  to clindamycin .  Will discharge with follow-up with her oral  surgeon.     Additional history obtained:  -External records from outside source obtained and reviewed including: Chart review including previous notes, labs, imaging, consultation notes   Lab Tests: -I ordered, reviewed, and interpreted labs.   The pertinent results include:   Labs Reviewed  CBC WITH DIFFERENTIAL/PLATELET - Abnormal; Notable for the following components:      Result Value   Hemoglobin 11.5 (*)    HCT 34.4 (*)    All other components within normal limits  BASIC METABOLIC PANEL WITH GFR  HCG, QUANTITATIVE, PREGNANCY      Imaging Studies ordered: I ordered imaging studies including CT max face I independently visualized and interpreted imaging. I agree with the radiologist interpretation   Medicines ordered and prescription drug management: Meds ordered this encounter  Medications   ketorolac  (TORADOL ) 15 MG/ML injection 10 mg   iohexol  (OMNIPAQUE ) 300 MG/ML solution 80 mL   DISCONTD: amoxicillin -clavulanate (AUGMENTIN ) 875-125 MG per tablet 1 tablet   clindamycin  (CLEOCIN ) capsule 300 mg    -I have reviewed the patients home medicines and have made  adjustments as needed    Cardiac Monitoring: The patient was maintained on a cardiac monitor.  I personally viewed and interpreted the cardiac monitored which showed an underlying rhythm of: Normal sinus rhythm  Social Determinants of Health:  Factors impacting patients care include: Lack of access to primary care   Reevaluation: After the interventions noted above, I reevaluated the patient and found that they have :improved  Co morbidities that complicate the patient evaluation  Past Medical History:  Diagnosis Date   Anemia    Asthma    Family history of congenital anomaly of genitourinary system 04/03/2016   Previous child w/ pyelectasis requiring surgery. Husband w/ Hx Pyelectasis. MFM recommends F/U US  at 28 and 36 weeks to eval for pyelectasis.   Postoperative deep vein thrombosis (DVT)  (HCC) 2005   after spinal surgery      Dispostion: I considered admission for this patient, however with her overall reassuring workup she is appropriate for discharge.     Final Clinical Impression(s) / ED Diagnoses Final diagnoses:  Dental infection     @PCDICTATION @    Mannie Pac T, DO 10/30/23 2120

## 2023-10-30 NOTE — ED Notes (Signed)
 This RN explained the risk of ketorolac  medication administration prior to hCG results. Patient stated understanding and continues to request medication to pain relief. Dr. Mannie also provided verbal OK to administer medication.

## 2023-10-30 NOTE — ED Notes (Signed)
 Patient transported to CT

## 2023-11-02 ENCOUNTER — Other Ambulatory Visit: Payer: Self-pay | Admitting: Certified Nurse Midwife

## 2023-11-02 DIAGNOSIS — B3731 Acute candidiasis of vulva and vagina: Secondary | ICD-10-CM

## 2023-11-02 MED ORDER — FLUCONAZOLE 150 MG PO TABS: 150.0000 mg | ORAL_TABLET | ORAL | 0 refills | Status: AC

## 2023-11-15 ENCOUNTER — Encounter: Payer: Self-pay | Admitting: Certified Nurse Midwife

## 2023-11-15 DIAGNOSIS — F901 Attention-deficit hyperactivity disorder, predominantly hyperactive type: Secondary | ICD-10-CM

## 2023-11-17 MED ORDER — LISDEXAMFETAMINE DIMESYLATE 40 MG PO CAPS: 40.0000 mg | ORAL_CAPSULE | ORAL | 0 refills | Status: DC

## 2023-12-27 ENCOUNTER — Other Ambulatory Visit: Payer: Self-pay | Admitting: Certified Nurse Midwife

## 2023-12-27 DIAGNOSIS — F901 Attention-deficit hyperactivity disorder, predominantly hyperactive type: Secondary | ICD-10-CM

## 2023-12-27 MED ORDER — LISDEXAMFETAMINE DIMESYLATE 40 MG PO CAPS: 40.0000 mg | ORAL_CAPSULE | ORAL | 0 refills | Status: DC

## 2023-12-29 ENCOUNTER — Telehealth: Payer: Self-pay | Admitting: Lactation Services

## 2023-12-29 NOTE — Telephone Encounter (Signed)
 PA for Vyvanse  sent to covermymeds, awaiting determination.   Melinda Higgins (Key: [id number]) PA Case ID #: [id number] Rx #: [id number] Need Help? Call us  at [phone] Status sent iconSent to Plan today Drug Lisdexamfetamine Dimesylate  40MG  capsules ePA cloud logo Form [employer] Electronic Prior Authorization Form Original Claim Info 75 Call [phone]. For a 3 day temporary supply, submit DUR PPS Level of Service Code 03. Use Preferred Brand. Consider SCC 10 override for qty limit [9G reject], after prescriber contact.. Member r

## 2023-12-30 NOTE — Telephone Encounter (Signed)
 Melinda Higgins (Key: [id number]) PA Case ID #: [id number] Rx #: [id number] Need Help? Call us  at [phone] Outcome N/A on October 8 by [employer] 2017 General.Closed by health plan.We thank you for taking the time to submit your request. However, this request has been closed because you told us  on 12/29/2023 that the member's medication has been switched to a recommended formulary alternative, Vyvanse  Oral Capsule 40 MG. Drug Lisdexamfetamine Dimesylate  40MG  capsules ePA cloud logo Form [employer] Electronic Prior Authorization Form Original Claim Info 75 Call [phone]. For a 3 day temporary supply, submit DUR PPS Level of Service Code 03. Use Preferred Brand. Consider SCC 10 override for qty limit [9G reject], after prescriber contact.. Member r  Called Pharmacy to check on status of medication. Spoke with Abby. She reports medication is ready for pick up. Called patient and informed her of status of medication. Patient voiced understanding.

## 2024-03-30 ENCOUNTER — Other Ambulatory Visit: Payer: Self-pay | Admitting: Certified Nurse Midwife

## 2024-03-30 DIAGNOSIS — F901 Attention-deficit hyperactivity disorder, predominantly hyperactive type: Secondary | ICD-10-CM

## 2024-03-30 MED ORDER — LISDEXAMFETAMINE DIMESYLATE 40 MG PO CAPS: 40.0000 mg | ORAL_CAPSULE | ORAL | 0 refills | Status: DC

## 2024-04-02 MED ORDER — LISDEXAMFETAMINE DIMESYLATE 40 MG PO CAPS: 40.0000 mg | ORAL_CAPSULE | ORAL | 0 refills | Status: AC

## 2024-04-02 NOTE — Addendum Note (Signed)
 Addended by: VANNIE MATAR on: 04/02/2024 03:49 PM   Modules accepted: Orders
